# Patient Record
Sex: Female | Born: 1965
Health system: Southern US, Community
[De-identification: ages and names within clinical notes are randomized; demographics above are authoritative.]

## PROBLEM LIST (undated history)

## (undated) DIAGNOSIS — B019 Varicella without complication: Secondary | ICD-10-CM

## (undated) DIAGNOSIS — F419 Anxiety disorder, unspecified: Secondary | ICD-10-CM

## (undated) DIAGNOSIS — F32A Depression, unspecified: Secondary | ICD-10-CM

## (undated) DIAGNOSIS — N870 Mild cervical dysplasia: Secondary | ICD-10-CM

## (undated) DIAGNOSIS — R519 Headache, unspecified: Secondary | ICD-10-CM

## (undated) DIAGNOSIS — R51 Headache: Secondary | ICD-10-CM

## (undated) DIAGNOSIS — N644 Mastodynia: Secondary | ICD-10-CM

## (undated) DIAGNOSIS — F329 Major depressive disorder, single episode, unspecified: Secondary | ICD-10-CM

## (undated) DIAGNOSIS — D649 Anemia, unspecified: Secondary | ICD-10-CM

## (undated) DIAGNOSIS — R87623 High grade squamous intraepithelial lesion on cytologic smear of vagina (HGSIL): Secondary | ICD-10-CM

## (undated) DIAGNOSIS — Z460 Encounter for fitting and adjustment of spectacles and contact lenses: Secondary | ICD-10-CM

## (undated) HISTORY — PX: FOOT BONE EXCISION: SUR493

## (undated) HISTORY — PX: LESION EXCISION: SHX5167

## (undated) HISTORY — PX: INGUINAL HERNIA REPAIR: SUR1180

## (undated) HISTORY — DX: High grade squamous intraepithelial lesion on cytologic smear of vagina (HGSIL): R87.623

## (undated) HISTORY — DX: Varicella without complication: B01.9

## (undated) HISTORY — DX: Mild cervical dysplasia: N87.0

## (undated) HISTORY — DX: Mastodynia: N64.4

## (undated) HISTORY — PX: METATARSAL OSTEOTOMY: SHX1641

## (undated) HISTORY — PX: SHOULDER ARTHROSCOPY: SHX128

## (undated) HISTORY — PX: LIPOMA EXCISION: SHX5283

## (undated) HISTORY — PX: KNEE ARTHROSCOPY W/ ACL RECONSTRUCTION: SHX1858

---

## 1998-09-29 ENCOUNTER — Other Ambulatory Visit: Admission: RE | Admit: 1998-09-29 | Discharge: 1998-09-29 | Payer: Self-pay | Admitting: Obstetrics & Gynecology

## 2000-02-21 ENCOUNTER — Other Ambulatory Visit: Admission: RE | Admit: 2000-02-21 | Discharge: 2000-02-21 | Payer: Self-pay | Admitting: Obstetrics & Gynecology

## 2001-01-21 ENCOUNTER — Other Ambulatory Visit: Admission: RE | Admit: 2001-01-21 | Discharge: 2001-01-21 | Payer: Self-pay | Admitting: Obstetrics & Gynecology

## 2002-03-05 ENCOUNTER — Other Ambulatory Visit: Admission: RE | Admit: 2002-03-05 | Discharge: 2002-03-05 | Payer: Self-pay | Admitting: Obstetrics & Gynecology

## 2002-04-17 HISTORY — PX: CERVICAL CONIZATION W/BX: SHX1330

## 2002-07-16 ENCOUNTER — Other Ambulatory Visit: Admission: RE | Admit: 2002-07-16 | Discharge: 2002-07-16 | Payer: Self-pay | Admitting: Obstetrics & Gynecology

## 2002-07-17 ENCOUNTER — Other Ambulatory Visit: Admission: RE | Admit: 2002-07-17 | Discharge: 2002-07-17 | Payer: Self-pay | Admitting: Obstetrics & Gynecology

## 2002-12-05 ENCOUNTER — Other Ambulatory Visit: Admission: RE | Admit: 2002-12-05 | Discharge: 2002-12-05 | Payer: Self-pay | Admitting: Obstetrics & Gynecology

## 2003-01-29 ENCOUNTER — Other Ambulatory Visit: Admission: RE | Admit: 2003-01-29 | Discharge: 2003-01-29 | Payer: Self-pay | Admitting: Obstetrics & Gynecology

## 2003-02-23 ENCOUNTER — Ambulatory Visit (HOSPITAL_COMMUNITY): Admission: RE | Admit: 2003-02-23 | Discharge: 2003-02-23 | Payer: Self-pay | Admitting: Obstetrics & Gynecology

## 2003-02-23 ENCOUNTER — Encounter (INDEPENDENT_AMBULATORY_CARE_PROVIDER_SITE_OTHER): Payer: Self-pay | Admitting: Specialist

## 2004-02-11 ENCOUNTER — Other Ambulatory Visit: Admission: RE | Admit: 2004-02-11 | Discharge: 2004-02-11 | Payer: Self-pay | Admitting: Obstetrics & Gynecology

## 2005-02-22 ENCOUNTER — Other Ambulatory Visit: Admission: RE | Admit: 2005-02-22 | Discharge: 2005-02-22 | Payer: Self-pay | Admitting: Obstetrics & Gynecology

## 2006-04-17 HISTORY — PX: LIPOMA EXCISION: SHX5283

## 2006-09-12 ENCOUNTER — Ambulatory Visit (HOSPITAL_BASED_OUTPATIENT_CLINIC_OR_DEPARTMENT_OTHER): Admission: RE | Admit: 2006-09-12 | Discharge: 2006-09-12 | Payer: Self-pay | Admitting: Orthopedic Surgery

## 2007-04-02 ENCOUNTER — Encounter (INDEPENDENT_AMBULATORY_CARE_PROVIDER_SITE_OTHER): Payer: Self-pay | Admitting: Surgery

## 2007-04-02 ENCOUNTER — Ambulatory Visit (HOSPITAL_BASED_OUTPATIENT_CLINIC_OR_DEPARTMENT_OTHER): Admission: RE | Admit: 2007-04-02 | Discharge: 2007-04-02 | Payer: Self-pay | Admitting: Surgery

## 2009-02-10 ENCOUNTER — Encounter: Admission: RE | Admit: 2009-02-10 | Discharge: 2009-02-10 | Payer: Self-pay | Admitting: Obstetrics & Gynecology

## 2009-04-17 HISTORY — PX: UMBILICAL HERNIA REPAIR: SHX196

## 2009-04-17 HISTORY — PX: HERNIA REPAIR: SHX51

## 2009-07-16 ENCOUNTER — Encounter: Admission: RE | Admit: 2009-07-16 | Discharge: 2009-07-16 | Payer: Self-pay | Admitting: General Surgery

## 2010-08-30 NOTE — Op Note (Signed)
[name], UN         ACCOUNT NO.:  [id number]   MEDICAL RECORD NO.:  [id number]          PATIENT TYPE:  AMB   LOCATION:  DSC                          FACILITY:  [facility]   PHYSICIAN:  Mila Homer. Sherlean Foot, M.D. DATE OF BIRTH:  [birth year]   DATE OF PROCEDURE:  09/12/2006  DATE OF DISCHARGE:                               OPERATIVE REPORT   SURGEON:  Mila Homer. Sherlean Foot, M.D.   ASSISTANTArlys John D. Petrarca, P.A.-C.   ANESTHESIA:  General.   PREOPERATIVE DIAGNOSIS:  Left knee chronic ACL tear and lateral meniscus  tear.   POSTOPERATIVE DIAGNOSIS:  Left knee chronic ACL tear and lateral  meniscus tear.   PROCEDURE:  Left knee arthroscopy, ACL reconstruction and partial  lateral meniscectomy.   INDICATIONS FOR PROCEDURE:  The patient is a 45 year old white female  with chronic ACL instability and now with an acute lateral meniscus tear  and informed consent was obtained.   DESCRIPTION OF PROCEDURE:  The patient was laid supine, administered  general anesthesia, the left leg was prepped and draped in the usual  sterile fashion.  Inferolateral inferomedial portals created with a #11  blade, blunt trocar and cannula.  There was no chondromalacia in the  joint at all.  There was a chronic ACL tear with __________ sign.  There  was a complex posterior horn lateral meniscus tear, partial lateral  meniscectomy was performed a straight basket forceps and a Conservation officer, nature.  Medial meniscus had a far peripheral undersurface tear that was  not full thickness not all the way through.  I debrided this area,  elected not to put any devices in since this knee was so small, I felt  like things would be quite prominent.  I then debrided the old ACL and  used a 4-mm cylindrical bur to perform a notchplasty.  I then used  Arthrex system creating 10-mm tibial and femoral tunnels.  I then placed  a nitinol wire anterior to the femoral tunnel, placed a 7 x 25 mm  interference screw.  Then  with tension on the graft and posterior joint  10 degrees flexion, I placed a 8 x 25 mm interference screw in the tibia  with the nitinol wire anterior to the tibial bone plug entering into the  joint.  I then assessed clinically and visually and had negative drawer,  Lachman.  I lavaged the knee then let the tourniquet down.  This was at  27 minutes.  I then irrigated closed with 4-0 nylon suture, dressed with  Xeroform dressing sponges, sterile Webril and Ace wrap.   COMPLICATIONS:  None.   DRAINS:  None.           ______________________________  Mila Homer. Sherlean Foot, M.D.    SDL/MEDQ  D:  09/12/2006  T:  09/12/2006  Job:  [id number]

## 2010-08-30 NOTE — Op Note (Signed)
Sara Davidson, SWARTZENTRUBER         ACCOUNT NO.:  [id number]   MEDICAL RECORD NO.:  [id number]          PATIENT TYPE:  AMB   LOCATION:  DSC                          FACILITY:  [facility]   PHYSICIAN:  Currie Paris, M.D.DATE OF BIRTH:  [birth year]   DATE OF PROCEDURE:  04/02/2007  DATE OF DISCHARGE:                               OPERATIVE REPORT   OFFICE MEDICAL RECORD NUMBER:  [id number]   PREOPERATIVE DIAGNOSIS:  Soft tissue mass, left forearm, probable  lipoma.   POSTOPERATIVE DIAGNOSIS:  Soft tissue mass, left forearm, probable  lipoma.   OPERATION:  Excision of small soft tissue mass, left forearm.   SURGEON:  Currie Paris, M.D.   ANESTHESIA:  Local (1% Xylocaine with epinephrine).   CLINICAL HISTORY:  This patient presented with a soft tissue mass that  felt like a lipoma of the left forearm and it was somewhat bothersome to  her, and she wished to have this removed.   DESCRIPTION OF PROCEDURE:  In the minor procedure room, we confirmed the  plans for the surgery.  The mass was identified and I circled it with a  pen so we knew its exact location.  The area was then anesthetized with  1% Xylocaine with epinephrine.   I waited about 5 minutes to 10 minutes for that soak in and then prepped  the area with Betadine and draped it as a sterile field.  I made a short  incision directly over the marked area and made sure I identified what  appeared to be a lipoma and dissected this out with hemostat dissection  and it came out intact.  A small vessel was attached to it that was  tied.   Once everything was dry, I closed with some 3-0 Vicryl and 4-0 Monocryl  subcuticular plus Dermabond.  The patient tolerated the procedure well  and there were no complications.      Currie Paris, M.D.  Electronically Signed     CJS/MEDQ  D:  04/02/2007  T:  04/03/2007  Job:  [id number]

## 2010-09-02 NOTE — Op Note (Signed)
Sara Davidson, Sara Davidson                   ACCOUNT NO.:  [id number]   MEDICAL RECORD NO.:  [id number]                   PATIENT TYPE:  AMB   LOCATION:  SDC                                  FACILITY:  [facility]   PHYSICIAN:  Gerrit Friends. Aldona Bar, M.D.                DATE OF BIRTH:  January 17, 1966   DATE OF PROCEDURE:  02/23/2003  DATE OF DISCHARGE:                                 OPERATIVE REPORT   PREOPERATIVE DIAGNOSIS:  Persistent dysplasia of the cervix.   POSTOPERATIVE DIAGNOSIS:  Persistent dysplasia of the cervix, pathology  pending.   PROCEDURE:  Conization of the cervix.   SURGEON:  Gerrit Friends. Aldona Bar, M.D.   ANESTHESIA:  Intravenous conscious sedation plus circumferential local  anesthesia to the cervix with 1% Xylocaine with epinephrine, 18 mL.   SURGEON:  Gerrit Friends. Aldona Bar, M.D.   HISTORY:  This 45 year old patient had moderate dysplasia and underwent  colposcopy and subsequent cryotherapy.  Subsequent to the cryotherapy, her  Pap smear remained as mild dysplasia.  This was repeated with the same.  She  underwent re-colposcopy with the finding of a lesion seen just within the  external os, and the transformation zone was not seen.  A decision was made  to proceed with the conization of the cervix for persistent dysplasia of the  cervix.   DESCRIPTION OF PROCEDURE:  The patient was taken to the operating room,  where after the satisfactory introduction of intravenous conscious sedation  she was prepped and draped having been placed in the modified lithotomy  position in the short Allen stirrups in the usual manner for vaginal  surgery.   At this time a speculum was placed and a single-tooth tenaculum placed on  the anterior lip of the cervix.  The cervix was circumferentially injected  with 1% Xylocaine without epinephrine totaling 18 mL.  After good anesthetic  timing, the procedure was begun.  Using the #11 blade, the conization of the  cervix was carried out in the usual  fashion, encompassing most of the  endocervix.  The specimen was removed and pinned appropriately, was sent to  the lab.  The remaining endocervix was then curetted with a Kevorkian  curette and that specimen likewise sent labeled appropriately.  At this time  using the coagulating ball, the conization bed was adequately coagulated.  Thereafter as extra insurance, Monsel's solution was applied to the  conization bed.  All instruments at this time were removed and the procedure  was felt to be complete.  Estimated blood loss negligible.  All counts  correct x2.  Pathologic specimen consisted of conization of the cervix and  endocervical curettings.  The patient was taken to the recovery room in  satisfactory condition, having tolerated the procedure well.  Estimated  blood loss as mentioned.  She will be discharged to home and will follow up  in the office and will be given appropriate instructions at the time of  discharge.                                              Gerrit Friends. Aldona Bar, M.D.   RMW/MEDQ  D:  02/23/2003  T:  02/23/2003  Job:  [id number]

## 2011-05-22 ENCOUNTER — Other Ambulatory Visit: Payer: Self-pay | Admitting: Obstetrics & Gynecology

## 2012-06-04 ENCOUNTER — Other Ambulatory Visit: Payer: Self-pay | Admitting: Obstetrics & Gynecology

## 2012-06-04 DIAGNOSIS — R928 Other abnormal and inconclusive findings on diagnostic imaging of breast: Secondary | ICD-10-CM

## 2012-06-07 ENCOUNTER — Ambulatory Visit
Admission: RE | Admit: 2012-06-07 | Discharge: 2012-06-07 | Disposition: A | Discharge status: Home / Self Care | Payer: BC Managed Care – PPO | Source: Ambulatory Visit | Attending: Obstetrics & Gynecology | Admitting: Obstetrics & Gynecology

## 2012-06-07 ENCOUNTER — Other Ambulatory Visit: Payer: Self-pay | Admitting: Obstetrics & Gynecology

## 2012-06-07 DIAGNOSIS — R928 Other abnormal and inconclusive findings on diagnostic imaging of breast: Secondary | ICD-10-CM

## 2012-06-10 ENCOUNTER — Ambulatory Visit
Admission: RE | Admit: 2012-06-10 | Discharge: 2012-06-10 | Disposition: A | Discharge status: Home / Self Care | Payer: BC Managed Care – PPO | Source: Ambulatory Visit | Attending: Obstetrics & Gynecology | Admitting: Obstetrics & Gynecology

## 2012-06-10 DIAGNOSIS — R928 Other abnormal and inconclusive findings on diagnostic imaging of breast: Secondary | ICD-10-CM

## 2012-06-24 ENCOUNTER — Ambulatory Visit (INDEPENDENT_AMBULATORY_CARE_PROVIDER_SITE_OTHER): Payer: BC Managed Care – PPO | Admitting: General Surgery

## 2012-06-24 ENCOUNTER — Encounter (INDEPENDENT_AMBULATORY_CARE_PROVIDER_SITE_OTHER): Payer: Self-pay | Admitting: General Surgery

## 2012-06-24 VITALS — BP 100/70 | HR 79 | Temp 98.5°F | Resp 18 | Ht 66.0 in | Wt 134.0 lb

## 2012-06-24 DIAGNOSIS — R921 Mammographic calcification found on diagnostic imaging of breast: Secondary | ICD-10-CM

## 2012-06-24 DIAGNOSIS — R928 Other abnormal and inconclusive findings on diagnostic imaging of breast: Secondary | ICD-10-CM

## 2012-06-24 NOTE — Progress Notes (Signed)
Patient ID: Sara Davidson, Davidson   DOB: [birth year], 47 y.o.   MRN: [id number]  Chief Complaint  Patient presents with  . New Evaluation    eval Rt br calcification    HPI Sara Davidson.  Referred by Dr. Anselmo Pickler HPI This is a 9 her old Davidson who is otherwise healthy. She has a family history of breast cancer her mother in 2012. She has no history referable to either breast. She underwent a screening mammogram recently which showed an indeterminate date area of calcifications in the upper right breast measuring 3 mm. This is a by rads 4 mammogram. She was recommended for stereotactic biopsy. These were not able to be adequately visualized for stereotactic biopsy. She was then referred for evaluation for surgical excision of these areas. She comes in today to discuss surgery as well as possible follow up with six-month mammography. She has no complaints with either breast today. History reviewed. No pertinent past medical history.  Past Surgical History  Procedure Laterality Date  . Knee arthroscopy w/ acl reconstruction  [id number]    Left  . Hernia repair  2011    UMB    Family History  Problem Relation Age of Onset  . Cancer Mother     Breast  . Bipolar disorder Father     Social History History  Substance Use Topics  . Smoking status: Never Smoker   . Smokeless tobacco: Never Used  . Alcohol Use: Yes     Comment: 2/day.    Allergies  Allergen Reactions  . Sulfa Antibiotics Hives    Current Outpatient Prescriptions  Medication Sig Dispense Refill  . buPROPion (WELLBUTRIN XL) 150 MG 24 hr tablet Take 150 mg by mouth daily.      . calcium carbonate 200 MG capsule Take 250 mg by mouth 2 (two) times daily with a meal.      . citalopram (CELEXA) 10 MG tablet Take 10 mg by mouth daily.      Marland Kitchen co-enzyme Q-10 30 MG capsule Take 30 mg by mouth 3 (three) times daily.      . fish oil-omega-3 fatty acids 1000 MG capsule Take 2 g by mouth  daily.      Marland Kitchen lamoTRIgine (LAMICTAL) 150 MG tablet Take 150 mg by mouth daily.      . Multiple Vitamins-Minerals (MULTIVITAMIN WITH MINERALS) tablet Take 1 tablet by mouth daily.       No current facility-administered medications for this visit.    Review of Systems Review of Systems  Constitutional: Negative for fever, chills and unexpected weight change.  HENT: Negative for hearing loss, congestion, sore throat, trouble swallowing and voice change.   Eyes: Negative for visual disturbance.  Respiratory: Negative for cough and wheezing.   Cardiovascular: Negative for chest pain, palpitations and leg swelling.  Gastrointestinal: Negative for nausea, vomiting, abdominal pain, diarrhea, constipation, blood in stool, abdominal distention and anal bleeding.  Genitourinary: Negative for hematuria, vaginal bleeding and difficulty urinating.  Musculoskeletal: Negative for arthralgias.  Skin: Negative for rash and wound.  Neurological: Negative for seizures, syncope and headaches.  Hematological: Negative for adenopathy. Does not bruise/bleed easily.  Psychiatric/Behavioral: Negative for confusion.    Blood pressure 100/70, pulse 79, temperature 98.5 F (36.9 C), temperature source Temporal, resp. rate 18, height 5\' 6"  (1.676 m), weight 134 lb (60.782 kg).  Physical Exam Physical Exam  Vitals reviewed. Constitutional: She appears well-developed and well-nourished.  Neck: Neck supple.  Cardiovascular: Normal rate,  regular rhythm and normal heart sounds.   Pulmonary/Chest: Effort normal and breath sounds normal. She has no wheezes. She has no rales. Right breast exhibits no inverted nipple, no mass, no nipple discharge, no skin change and no tenderness. Left breast exhibits no inverted nipple, no mass, no nipple discharge, no skin change and no tenderness. Breasts are symmetrical.  Lymphadenopathy:    She has no cervical adenopathy.    She has no axillary adenopathy.       Right: No  supraclavicular adenopathy present.       Left: No supraclavicular adenopathy present.    Data Reviewed Following discussion with her husband, the patient would like to  arrange consultation with Dr. Dwain Sarna at [facility]  surgery. This has been arranged for 06/24/2012 and I have  contacted the patient to give her this appointment information.  The patient was encouraged to call the Breast Center for additional  questions or concerns.  Addended by: Rolla Plate, M.D. on 06/11/2012 09:20:15.  **END ADDENDUM** SIGNED BY: Rolla Plate, M.D.       [name], MD Mon Jun 10, 2012 4:28:47 PM EST       **ADDENDUM** CREATED: 06/10/2012 16:20:01  The patient returns for possible right breast stereotactic core  biopsy of a very faint calcifications located within the superior  portion of the right breast. Attempts were made to visualize this  faint cluster of calcifications in both the lateral and CC  projections. However, these were not adequate visualized for  stereotactic core biopsy. Additional magnification CC views were  obtained to better demonstrate the calcifications in the CC  projection. These demonstrate a small cluster of calcifications to  be located slightly medially within the central right breast.  There are also scattered punctate benign-appearing calcifications  noted within the superior and medial right breast. I have  discussed options with the patient. I recommend surgical  consultation for possible surgical excisional biopsy of the small  faint cluster of calcifications. If the patient following this  consultation prefers imaging follow-up evaluation, I recommend  right breast diagnostic mammography with magnification views in 6  months.  The patient preferred to talk to her husband prior to arranging  surgical consultation. She will contact the Breast Center to help  arrange surgical consultation following this conversation with the   husband.     Assessment    Right breast calcifications not amenable to stereotactic biopsy    Plan    We discussed a 6 month follow-up versus a biopsy. Unfortunately she is not amenable to a stereotactic biopsy. These areas are a BI-RADS 4. She does have a family history her mother of breast cancer which raises her risk as well. We discussed that with a 6 month followup this probably would not be breast cancer but we cannot guarantee her that. I discussed a right breast wire localized excision of these calcifications in the upper right breast. We discussed how that would be performed and the risks associated with it. I did tell them that this was the only way to ensure that this was not a precancerous or cancerous area. It is likely benign but I also recommended excision because of the appearance, this is new, and her family history I recommended excision of these areas. She's going to consider this overnight and will call me back.        WAKEFIELD,MATTHEW 06/24/2012, 3:44 PM

## 2012-06-26 ENCOUNTER — Other Ambulatory Visit (INDEPENDENT_AMBULATORY_CARE_PROVIDER_SITE_OTHER): Payer: Self-pay | Admitting: General Surgery

## 2012-06-26 DIAGNOSIS — R921 Mammographic calcification found on diagnostic imaging of breast: Secondary | ICD-10-CM

## 2012-07-01 ENCOUNTER — Encounter (HOSPITAL_BASED_OUTPATIENT_CLINIC_OR_DEPARTMENT_OTHER): Payer: Self-pay | Admitting: *Deleted

## 2012-07-01 NOTE — Progress Notes (Signed)
To come in for [facility] labs- 

## 2012-07-03 ENCOUNTER — Encounter (HOSPITAL_BASED_OUTPATIENT_CLINIC_OR_DEPARTMENT_OTHER)
Admission: RE | Admit: 2012-07-03 | Discharge: 2012-07-03 | Disposition: A | Discharge status: Home / Self Care | Payer: BC Managed Care – PPO | Source: Ambulatory Visit | Attending: General Surgery | Admitting: General Surgery

## 2012-07-03 LAB — CBC WITH DIFFERENTIAL/PLATELET
Basophils Relative: 1 % (ref 0–1)
HCT: 37.5 % (ref 36.0–46.0)
Lymphocytes Relative: 29 % (ref 12–46)
Lymphs Abs: 1.4 10*3/uL (ref 0.7–4.0)
MCHC: 34.1 g/dL (ref 30.0–36.0)
MCV: 90.8 fL (ref 78.0–100.0)
Monocytes Relative: 10 % (ref 3–12)
Neutrophils Relative %: 58 % (ref 43–77)
RBC: 4.13 MIL/uL (ref 3.87–5.11)
WBC: 4.8 10*3/uL (ref 4.0–10.5)

## 2012-07-03 LAB — BASIC METABOLIC PANEL
Calcium: 9.5 mg/dL (ref 8.4–10.5)
Potassium: 4.4 mEq/L (ref 3.5–5.1)
Sodium: 138 mEq/L (ref 135–145)

## 2012-07-04 ENCOUNTER — Ambulatory Visit (HOSPITAL_BASED_OUTPATIENT_CLINIC_OR_DEPARTMENT_OTHER)
Admission: RE | Admit: 2012-07-04 | Discharge: 2012-07-04 | Disposition: A | Discharge status: Home / Self Care | Payer: BC Managed Care – PPO | Source: Ambulatory Visit | Attending: General Surgery | Admitting: General Surgery

## 2012-07-04 ENCOUNTER — Ambulatory Visit (HOSPITAL_BASED_OUTPATIENT_CLINIC_OR_DEPARTMENT_OTHER): Payer: BC Managed Care – PPO | Admitting: Anesthesiology

## 2012-07-04 ENCOUNTER — Ambulatory Visit
Admission: RE | Admit: 2012-07-04 | Discharge: 2012-07-04 | Disposition: A | Discharge status: Home / Self Care | Payer: BC Managed Care – PPO | Source: Ambulatory Visit | Attending: General Surgery | Admitting: General Surgery

## 2012-07-04 ENCOUNTER — Encounter (HOSPITAL_BASED_OUTPATIENT_CLINIC_OR_DEPARTMENT_OTHER): Payer: Self-pay | Admitting: Anesthesiology

## 2012-07-04 ENCOUNTER — Encounter (HOSPITAL_BASED_OUTPATIENT_CLINIC_OR_DEPARTMENT_OTHER)
Admission: RE | Disposition: A | Discharge status: Home / Self Care | Payer: Self-pay | Source: Ambulatory Visit | Attending: General Surgery

## 2012-07-04 ENCOUNTER — Encounter (HOSPITAL_BASED_OUTPATIENT_CLINIC_OR_DEPARTMENT_OTHER): Payer: Self-pay | Admitting: *Deleted

## 2012-07-04 DIAGNOSIS — N6019 Diffuse cystic mastopathy of unspecified breast: Secondary | ICD-10-CM

## 2012-07-04 DIAGNOSIS — Z01812 Encounter for preprocedural laboratory examination: Secondary | ICD-10-CM | POA: Insufficient documentation

## 2012-07-04 DIAGNOSIS — R920 Mammographic microcalcification found on diagnostic imaging of breast: Secondary | ICD-10-CM

## 2012-07-04 DIAGNOSIS — Z803 Family history of malignant neoplasm of breast: Secondary | ICD-10-CM | POA: Insufficient documentation

## 2012-07-04 DIAGNOSIS — R921 Mammographic calcification found on diagnostic imaging of breast: Secondary | ICD-10-CM

## 2012-07-04 HISTORY — DX: Depression, unspecified: F32.A

## 2012-07-04 HISTORY — PX: BREAST BIOPSY: SHX20

## 2012-07-04 HISTORY — DX: Major depressive disorder, single episode, unspecified: F32.9

## 2012-07-04 HISTORY — DX: Encounter for fitting and adjustment of spectacles and contact lenses: Z46.0

## 2012-07-04 HISTORY — DX: Anxiety disorder, unspecified: F41.9

## 2012-07-04 LAB — POCT HEMOGLOBIN-HEMACUE: Hemoglobin: 14.5 g/dL (ref 12.0–15.0)

## 2012-07-04 SURGERY — BREAST BIOPSY WITH NEEDLE LOCALIZATION
Anesthesia: General | Site: Breast | Laterality: Right | Wound class: Clean

## 2012-07-04 MED ORDER — EPHEDRINE SULFATE 50 MG/ML IJ SOLN
INTRAMUSCULAR | Status: DC | PRN
  Administered 2012-07-04 (×2): 10 mg via INTRAVENOUS

## 2012-07-04 MED ORDER — BUPIVACAINE HCL (PF) 0.25 % IJ SOLN
INTRAMUSCULAR | Status: DC | PRN
  Administered 2012-07-04: 10 mL

## 2012-07-04 MED ORDER — OXYCODONE HCL 5 MG/5ML PO SOLN: 5.0000 mg | Freq: Once | ORAL | Status: DC | PRN

## 2012-07-04 MED ORDER — CEFAZOLIN SODIUM-DEXTROSE 2-3 GM-% IV SOLR
2.0000 g | INTRAVENOUS | Status: AC
  Administered 2012-07-04: 2 g via INTRAVENOUS

## 2012-07-04 MED ORDER — SUCCINYLCHOLINE CHLORIDE 20 MG/ML IJ SOLN
INTRAMUSCULAR | Status: DC | PRN
  Administered 2012-07-04: 40 mg via INTRAVENOUS

## 2012-07-04 MED ORDER — ONDANSETRON HCL 4 MG/2ML IJ SOLN
INTRAMUSCULAR | Status: DC | PRN
  Administered 2012-07-04: 4 mg via INTRAVENOUS

## 2012-07-04 MED ORDER — DEXAMETHASONE SODIUM PHOSPHATE 4 MG/ML IJ SOLN
INTRAMUSCULAR | Status: DC | PRN
  Administered 2012-07-04: 10 mg via INTRAVENOUS

## 2012-07-04 MED ORDER — SUFENTANIL CITRATE 50 MCG/ML IV SOLN
INTRAVENOUS | Status: DC | PRN
  Administered 2012-07-04: 10 ug via INTRAVENOUS

## 2012-07-04 MED ORDER — FENTANYL CITRATE 0.05 MG/ML IJ SOLN: 50.0000 ug | INTRAMUSCULAR | Status: DC | PRN

## 2012-07-04 MED ORDER — MIDAZOLAM HCL 2 MG/2ML IJ SOLN: 1.0000 mg | INTRAMUSCULAR | Status: DC | PRN

## 2012-07-04 MED ORDER — OXYCODONE-ACETAMINOPHEN 5-325 MG PO TABS: 1.0000 | ORAL_TABLET | ORAL | Status: DC | PRN

## 2012-07-04 MED ORDER — MIDAZOLAM HCL 5 MG/5ML IJ SOLN
INTRAMUSCULAR | Status: DC | PRN
  Administered 2012-07-04: 2 mg via INTRAVENOUS

## 2012-07-04 MED ORDER — OXYCODONE HCL 5 MG PO TABS: 5.0000 mg | ORAL_TABLET | Freq: Once | ORAL | Status: DC | PRN

## 2012-07-04 MED ORDER — LACTATED RINGERS IV SOLN
INTRAVENOUS | Status: DC
  Administered 2012-07-04: 11:00:00 via INTRAVENOUS
  Administered 2012-07-04: 20 mL/h via INTRAVENOUS

## 2012-07-04 MED ORDER — HYDROMORPHONE HCL PF 1 MG/ML IJ SOLN
0.2500 mg | INTRAMUSCULAR | Status: DC | PRN
  Administered 2012-07-04 (×4): 0.5 mg via INTRAVENOUS

## 2012-07-04 MED ORDER — LIDOCAINE HCL (CARDIAC) 20 MG/ML IV SOLN
INTRAVENOUS | Status: DC | PRN
  Administered 2012-07-04: 50 mg via INTRAVENOUS

## 2012-07-04 SURGICAL SUPPLY — 54 items
ADH SKN CLS APL DERMABOND .7 (GAUZE/BANDAGES/DRESSINGS)
APL SKNCLS STERI-STRIP NONHPOA (GAUZE/BANDAGES/DRESSINGS) ×1
APPLIER CLIP 9.375 MED OPEN (MISCELLANEOUS)
APR CLP MED 9.3 20 MLT OPN (MISCELLANEOUS)
BENZOIN TINCTURE PRP APPL 2/3 (GAUZE/BANDAGES/DRESSINGS) ×2 IMPLANT
BINDER BREAST LRG (GAUZE/BANDAGES/DRESSINGS) IMPLANT
BINDER BREAST MEDIUM (GAUZE/BANDAGES/DRESSINGS) IMPLANT
BINDER BREAST XLRG (GAUZE/BANDAGES/DRESSINGS) IMPLANT
BINDER BREAST XXLRG (GAUZE/BANDAGES/DRESSINGS) IMPLANT
BLADE SURG 15 STRL LF DISP TIS (BLADE) ×1 IMPLANT
BLADE SURG 15 STRL SS (BLADE) ×2
CANISTER SUCTION 1200CC (MISCELLANEOUS) IMPLANT
CHLORAPREP W/TINT 26ML (MISCELLANEOUS) ×2 IMPLANT
CLIP APPLIE 9.375 MED OPEN (MISCELLANEOUS) IMPLANT
CLOTH BEACON ORANGE TIMEOUT ST (SAFETY) ×2 IMPLANT
COVER MAYO STAND STRL (DRAPES) ×2 IMPLANT
COVER TABLE BACK 60X90 (DRAPES) ×2 IMPLANT
DECANTER SPIKE VIAL GLASS SM (MISCELLANEOUS) IMPLANT
DERMABOND ADVANCED (GAUZE/BANDAGES/DRESSINGS)
DERMABOND ADVANCED .7 DNX12 (GAUZE/BANDAGES/DRESSINGS) IMPLANT
DEVICE DUBIN W/COMP PLATE 8390 (MISCELLANEOUS) IMPLANT
DRAPE PED LAPAROTOMY (DRAPES) ×2 IMPLANT
DRSG TEGADERM 4X4.75 (GAUZE/BANDAGES/DRESSINGS) ×2 IMPLANT
ELECT COATED BLADE 2.86 ST (ELECTRODE) ×2 IMPLANT
ELECT REM PT RETURN 9FT ADLT (ELECTROSURGICAL) ×2
ELECTRODE REM PT RTRN 9FT ADLT (ELECTROSURGICAL) ×1 IMPLANT
GAUZE SPONGE 4X4 12PLY STRL LF (GAUZE/BANDAGES/DRESSINGS) ×2 IMPLANT
GLOVE BIO SURGEON STRL SZ7 (GLOVE) ×2 IMPLANT
GLOVE BIOGEL M 7.0 STRL (GLOVE) ×1 IMPLANT
GLOVE BIOGEL PI IND STRL 7.5 (GLOVE) ×1 IMPLANT
GLOVE BIOGEL PI INDICATOR 7.5 (GLOVE) ×2
GOWN PREVENTION PLUS XLARGE (GOWN DISPOSABLE) ×2 IMPLANT
NDL HYPO 25X1 1.5 SAFETY (NEEDLE) ×1 IMPLANT
NEEDLE HYPO 25X1 1.5 SAFETY (NEEDLE) ×2 IMPLANT
NS IRRIG 1000ML POUR BTL (IV SOLUTION) IMPLANT
PACK BASIN DAY SURGERY FS (CUSTOM PROCEDURE TRAY) ×2 IMPLANT
PENCIL BUTTON HOLSTER BLD 10FT (ELECTRODE) ×2 IMPLANT
SLEEVE SCD COMPRESS KNEE MED (MISCELLANEOUS) ×2 IMPLANT
SPONGE LAP 4X18 X RAY DECT (DISPOSABLE) ×2 IMPLANT
STRIP CLOSURE SKIN 1/2X4 (GAUZE/BANDAGES/DRESSINGS) ×2 IMPLANT
SUT MNCRL AB 4-0 PS2 18 (SUTURE) IMPLANT
SUT MON AB 5-0 PS2 18 (SUTURE) IMPLANT
SUT SILK 2 0 SH (SUTURE) ×2 IMPLANT
SUT VIC AB 2-0 SH 27 (SUTURE) ×2
SUT VIC AB 2-0 SH 27XBRD (SUTURE) ×1 IMPLANT
SUT VIC AB 3-0 SH 27 (SUTURE) ×2
SUT VIC AB 3-0 SH 27X BRD (SUTURE) ×1 IMPLANT
SUT VIC AB 5-0 PS2 18 (SUTURE) IMPLANT
SUT VICRYL AB 3 0 TIES (SUTURE) IMPLANT
SYR CONTROL 10ML LL (SYRINGE) ×2 IMPLANT
TOWEL OR 17X24 6PK STRL BLUE (TOWEL DISPOSABLE) ×2 IMPLANT
TOWEL OR NON WOVEN STRL DISP B (DISPOSABLE) ×2 IMPLANT
TUBE CONNECTING 20X1/4 (TUBING) IMPLANT
YANKAUER SUCT BULB TIP NO VENT (SUCTIONS) IMPLANT

## 2012-07-04 NOTE — Anesthesia Preprocedure Evaluation (Addendum)
Anesthesia Evaluation  Patient identified by MRN, date of birth, ID band Patient awake    Reviewed: Allergy & Precautions, H&P , NPO status , Patient's Chart, lab work & pertinent test results  Airway Mallampati: III TM Distance: >3 FB Neck ROM: Full    Dental no notable dental hx. (+) Teeth Intact and Dental Advisory Given   Pulmonary neg pulmonary ROS,  breath sounds clear to auscultation  Pulmonary exam normal       Cardiovascular negative cardio ROS  Rhythm:Regular Rate:Normal     Neuro/Psych PSYCHIATRIC DISORDERS negative neurological ROS     GI/Hepatic negative GI ROS, Neg liver ROS,   Endo/Other  negative endocrine ROS  Renal/GU negative Renal ROS  negative genitourinary   Musculoskeletal   Abdominal   Peds  Hematology negative hematology ROS (+)   Anesthesia Other Findings   Reproductive/Obstetrics negative OB ROS                          Anesthesia Physical Anesthesia Plan  ASA: II  Anesthesia Plan: General   Post-op Pain Management:    Induction: Intravenous  Airway Management Planned: LMA  Additional Equipment:   Intra-op Plan:   Post-operative Plan: Extubation in OR  Informed Consent: I have reviewed the patients History and Physical, chart, labs and discussed the procedure including the risks, benefits and alternatives for the proposed anesthesia with the patient or authorized representative who has indicated his/her understanding and acceptance.   Dental advisory given  Plan Discussed with: CRNA  Anesthesia Plan Comments:         Anesthesia Quick Evaluation

## 2012-07-04 NOTE — Anesthesia Procedure Notes (Signed)
Procedure Name: LMA Insertion Date/Time: 07/04/2012 11:07 AM Performed by: Zenia Resides D Pre-anesthesia Checklist: Patient identified, Emergency Drugs available, Suction available and Patient being monitored Patient Re-evaluated:Patient Re-evaluated prior to inductionOxygen Delivery Method: Circle System Utilized Preoxygenation: Pre-oxygenation with 100% oxygen Intubation Type: IV induction Ventilation: Mask ventilation without difficulty LMA: LMA inserted LMA Size: 4.0 Number of attempts: 1 Airway Equipment and Method: bite block Placement Confirmation: positive ETCO2 and breath sounds checked- equal and bilateral Tube secured with: Tape Dental Injury: Teeth and Oropharynx as per pre-operative assessment

## 2012-07-04 NOTE — Transfer of Care (Signed)
Immediate Anesthesia Transfer of Care Note  Patient: Sara Davidson  Procedure(s) Performed: Procedure(s): Right Breast Wire Localized Excision (Right)  Patient Location: PACU  Anesthesia Type:General  Level of Consciousness: awake  Airway & Oxygen Therapy: Patient Spontanous Breathing and Patient connected to face mask oxygen  Post-op Assessment: Report given to PACU RN and Post -op Vital signs reviewed and stable  Post vital signs: Reviewed and stable  Complications: No apparent anesthesia complications

## 2012-07-04 NOTE — Op Note (Signed)
Preoperative diagnosis: BI-RADS 4 right breast microcalcifications Postoperative diagnosis: Same as above Procedure: Right breast wire localized excisional biopsy Surgeon: Dr. Harden Mo Anesthesia: Gen. With LMA Specimens: Right breast marked short stitch superior, long stitch lateral, double stitch deep Complications: None Estimated blood loss: Minimal Drains: None Sponge and needle count correct x2 at end of operation Disposition to recovery in stable condition  Indications: This is a 47 year-old female who underwent mammography with the finding of a BI-RADS 4 right breast microcalcification. This was not amenable to stereotactic biopsy. She was then referred to me and we discussed all of her options including observation with six-month follow-up versus a biopsy. We have elected to proceed with an excisional biopsy with wire guidance.  Procedure: After informed consent was obtained the patient first had a wire placed at the breast center. I had these mammograms in the operating room. She was taken to the operating room. She was administered 2 g of intravenous cefazolin. Sequential compression devices were on her legs. She was placed under general anesthesia with an LMA. Her right breast was then prepped and draped in the standard sterile surgical fashion. A surgical timeout was then performed.  A wire was about 5 cm away from her nipple areolar complex and 7 cm away from the hook to the entrance site. I ended up making a periareolar incision the superior aspect of the nipple areolar complex and dissected down to find the tip of the wire. I pulled the wire in from its remote position. I then excised the hook of the wire and the surrounding tissue only. This was then passed off the table as a specimen and marked. Faxitron mammogram confirmed removal of the wire as well as the intended area. Hemostasis was then obtained. I closed her deep breast tissue a 2-0 Vicryl. The dermis was closed with 3-0  Vicryl and the skin with 5-0 Monocryl. I infiltrated 10 cc of Marcaine. I then placed Dermabond and Steri-Strips over her incision. She tolerated this well was extubated and transferred to the recovery room stable.

## 2012-07-04 NOTE — Interval H&P Note (Signed)
History and Physical Interval Note:  07/04/2012 10:45 AM  Sara Davidson  has presented today for surgery, with the diagnosis of remove right breast abnormality  The various methods of treatment have been discussed with the patient and family. After consideration of risks, benefits and other options for treatment, the patient has consented to  Procedure(s): Right Breast Wire Localized Excision (Right) as a surgical intervention .  The patient's history has been reviewed, patient examined, no change in status, stable for surgery.  I have reviewed the patient's chart and labs.  Questions were answered to the patient's satisfaction.     [name]

## 2012-07-04 NOTE — Anesthesia Postprocedure Evaluation (Signed)
  Anesthesia Post-op Note  Patient: Sara Davidson  Procedure(s) Performed: Procedure(s): Right Breast Wire Localized Excision (Right)  Patient Location: PACU  Anesthesia Type:General  Level of Consciousness: awake, alert  and oriented  Airway and Oxygen Therapy: Patient Spontanous Breathing and Patient connected to face mask oxygen  Post-op Pain: moderate  Post-op Assessment: Post-op Vital signs reviewed, Patient's Cardiovascular Status Stable, Respiratory Function Stable, Patent Airway and No signs of Nausea or vomiting  Post-op Vital Signs: Reviewed and stable  Complications: No apparent anesthesia complications

## 2012-07-04 NOTE — H&P (View-Only) (Signed)
Patient ID: Sara Davidson, female   DOB: [birth year], 47 y.o.   MRN: [id number]  Chief Complaint  Patient presents with  . New Evaluation    eval Rt br calcification    HPI Sara Davidson is a 47 y.o. female.  Referred by Dr. Anselmo Pickler HPI This is a 20 her old female who is otherwise healthy. She has a family history of breast cancer her mother in 2012. She has no history referable to either breast. She underwent a screening mammogram recently which showed an indeterminate date area of calcifications in the upper right breast measuring 3 mm. This is a by rads 4 mammogram. She was recommended for stereotactic biopsy. These were not able to be adequately visualized for stereotactic biopsy. She was then referred for evaluation for surgical excision of these areas. She comes in today to discuss surgery as well as possible follow up with six-month mammography. She has no complaints with either breast today. History reviewed. No pertinent past medical history.  Past Surgical History  Procedure Laterality Date  . Knee arthroscopy w/ acl reconstruction  4098,1191    Left  . Hernia repair  2011    UMB    Family History  Problem Relation Age of Onset  . Cancer Mother     Breast  . Bipolar disorder Father     Social History History  Substance Use Topics  . Smoking status: Never Smoker   . Smokeless tobacco: Never Used  . Alcohol Use: Yes     Comment: 2/day.    Allergies  Allergen Reactions  . Sulfa Antibiotics Hives    Current Outpatient Prescriptions  Medication Sig Dispense Refill  . buPROPion (WELLBUTRIN XL) 150 MG 24 hr tablet Take 150 mg by mouth daily.      . calcium carbonate 200 MG capsule Take 250 mg by mouth 2 (two) times daily with a meal.      . citalopram (CELEXA) 10 MG tablet Take 10 mg by mouth daily.      Marland Kitchen co-enzyme Q-10 30 MG capsule Take 30 mg by mouth 3 (three) times daily.      . fish oil-omega-3 fatty acids 1000 MG capsule Take 2 g by mouth  daily.      Marland Kitchen lamoTRIgine (LAMICTAL) 150 MG tablet Take 150 mg by mouth daily.      . Multiple Vitamins-Minerals (MULTIVITAMIN WITH MINERALS) tablet Take 1 tablet by mouth daily.       No current facility-administered medications for this visit.    Review of Systems Review of Systems  Constitutional: Negative for fever, chills and unexpected weight change.  HENT: Negative for hearing loss, congestion, sore throat, trouble swallowing and voice change.   Eyes: Negative for visual disturbance.  Respiratory: Negative for cough and wheezing.   Cardiovascular: Negative for chest pain, palpitations and leg swelling.  Gastrointestinal: Negative for nausea, vomiting, abdominal pain, diarrhea, constipation, blood in stool, abdominal distention and anal bleeding.  Genitourinary: Negative for hematuria, vaginal bleeding and difficulty urinating.  Musculoskeletal: Negative for arthralgias.  Skin: Negative for rash and wound.  Neurological: Negative for seizures, syncope and headaches.  Hematological: Negative for adenopathy. Does not bruise/bleed easily.  Psychiatric/Behavioral: Negative for confusion.    Blood pressure 100/70, pulse 79, temperature 98.5 F (36.9 C), temperature source Temporal, resp. rate 18, height 5\' 6"  (1.676 m), weight 134 lb (60.782 kg).  Physical Exam Physical Exam  Vitals reviewed. Constitutional: She appears well-developed and well-nourished.  Neck: Neck supple.  Cardiovascular: Normal rate,  regular rhythm and normal heart sounds.   Pulmonary/Chest: Effort normal and breath sounds normal. She has no wheezes. She has no rales. Right breast exhibits no inverted nipple, no mass, no nipple discharge, no skin change and no tenderness. Left breast exhibits no inverted nipple, no mass, no nipple discharge, no skin change and no tenderness. Breasts are symmetrical.  Lymphadenopathy:    She has no cervical adenopathy.    She has no axillary adenopathy.       Right: No  supraclavicular adenopathy present.       Left: No supraclavicular adenopathy present.    Data Reviewed Following discussion with her husband, the patient would like to  arrange consultation with Dr. Dwain Sarna at [facility]  surgery. This has been arranged for 06/24/2012 and I have  contacted the patient to give her this appointment information.  The patient was encouraged to call the [facility] for additional  questions or concerns.  Addended by: Rolla Plate, M.D. on 06/11/2012 09:20:15.  **END ADDENDUM** SIGNED BY: Rolla Plate, M.D.       Sara Bang Sr., Sara Davidson Mon Jun 10, 2012 4:28:47 PM EST       **ADDENDUM** CREATED: 06/10/2012 16:20:01  The patient returns for possible right breast stereotactic core  biopsy of a very faint calcifications located within the superior  portion of the right breast. Attempts were made to visualize this  faint cluster of calcifications in both the lateral and CC  projections. However, these were not adequate visualized for  stereotactic core biopsy. Additional magnification CC views were  obtained to better demonstrate the calcifications in the CC  projection. These demonstrate a small cluster of calcifications to  be located slightly medially within the central right breast.  There are also scattered punctate benign-appearing calcifications  noted within the superior and medial right breast. I have  discussed options with the patient. I recommend surgical  consultation for possible surgical excisional biopsy of the small  faint cluster of calcifications. If the patient following this  consultation prefers imaging follow-up evaluation, I recommend  right breast diagnostic mammography with magnification views in 6  months.  The patient preferred to talk to her husband prior to arranging  surgical consultation. She will contact the [facility] to help  arrange surgical consultation following this conversation with the   husband.     Assessment    Right breast calcifications not amenable to stereotactic biopsy    Plan    We discussed a 6 month follow-up versus a biopsy. Unfortunately she is not amenable to a stereotactic biopsy. These areas are a BI-RADS 4. She does have a family history her mother of breast cancer which raises her risk as well. We discussed that with a 6 month followup this probably would not be breast cancer but we cannot guarantee her that. I discussed a right breast wire localized excision of these calcifications in the upper right breast. We discussed how that would be performed and the risks associated with it. I did tell them that this was the only way to ensure that this was not a precancerous or cancerous area. It is likely benign but I also recommended excision because of the appearance, this is new, and her family history I recommended excision of these areas. She's going to consider this overnight and will call me back.        WAKEFIELD,MATTHEW 06/24/2012, 3:44 PM

## 2012-07-05 ENCOUNTER — Encounter (HOSPITAL_BASED_OUTPATIENT_CLINIC_OR_DEPARTMENT_OTHER): Payer: Self-pay | Admitting: General Surgery

## 2012-07-13 ENCOUNTER — Telehealth (INDEPENDENT_AMBULATORY_CARE_PROVIDER_SITE_OTHER): Payer: Self-pay | Admitting: Surgery

## 2012-07-13 NOTE — Telephone Encounter (Signed)
S/p lumpectomy Dr Dwain Sarna 07/04/2012.  Pt drove to [facility] for trip.  Pt called c/o worsening yellow bruising/soreness since.  Taking ibuprofen.  Using support bra.  1 steristrip fell off.  Using peroxide daily.  No fevers/chills/sweats.  No drainage.  Not needing narcotics.  I recommended stop NSAIDs & switch to tylenol QID.  Narcotic for pain control.  Decrease activity over the weekend.  Continue support bra.  Add ice/heat/warms showers.  Probable post-op hematoma that will eventually resolve.    If worse, see [place] in office Mon/Tue to see the incision vs ED over the weekend.   O/w keep appt in 6 days.  She feels reassured.

## 2012-07-15 ENCOUNTER — Telehealth (INDEPENDENT_AMBULATORY_CARE_PROVIDER_SITE_OTHER): Payer: Self-pay

## 2012-07-15 NOTE — Telephone Encounter (Signed)
Called pt to check on her since she had to call Dr Michaell Cowing over the weekend b/c concerned with a lot of bruising. The pt stated she felt better after speaking with Dr Michaell Cowing and he advised using a warm heating pad on the right breast. The pt is worried about the incision not looking so good right now so I offered a earlier appt for tomorrow instead of Friday. The pt would like to do the earlier appt.

## 2012-07-16 ENCOUNTER — Ambulatory Visit (INDEPENDENT_AMBULATORY_CARE_PROVIDER_SITE_OTHER): Payer: BC Managed Care – PPO | Admitting: General Surgery

## 2012-07-16 ENCOUNTER — Encounter (INDEPENDENT_AMBULATORY_CARE_PROVIDER_SITE_OTHER): Payer: Self-pay | Admitting: General Surgery

## 2012-07-16 VITALS — BP 120/62 | HR 74 | Temp 97.6°F | Resp 18 | Ht 66.0 in | Wt 131.0 lb

## 2012-07-16 DIAGNOSIS — Z09 Encounter for follow-up examination after completed treatment for conditions other than malignant neoplasm: Secondary | ICD-10-CM

## 2012-07-16 NOTE — Progress Notes (Signed)
Subjective:     Patient ID: Sara Davidson, female   DOB: 1966-01-27, 47 y.o.   MRN: [id number]  HPI 47 yof s/p right breast wire loc biopsy for calcifications not amenable to stereo biopsy. Her path is benign, calcs were present.  She has some pain with increased activity in superior portion of breast and is concerned about her incision.  She is otherwise well.   Review of Systems     Objective:   Physical Exam Right breast incision periareolar healing without infection. Area she is concerned about is the dermabond and ridge at scar    Assessment:     S/p right breast biopsy     Plan:     We discussed her pathology today.  We discussed continued follow up for breast cancer with yearly mmg, clinical exam yearly and monthly sbe.  Her incision should heal just fine and we discussed leaving strips in place.  No more peroxide to be applied.  Once glue and strips come off she will massage with vit e lotion.  i will see back in 3 weeks

## 2012-07-19 ENCOUNTER — Encounter (INDEPENDENT_AMBULATORY_CARE_PROVIDER_SITE_OTHER): Payer: BC Managed Care – PPO | Admitting: General Surgery

## 2012-08-13 ENCOUNTER — Encounter (INDEPENDENT_AMBULATORY_CARE_PROVIDER_SITE_OTHER): Payer: Self-pay | Admitting: General Surgery

## 2012-08-13 ENCOUNTER — Ambulatory Visit (INDEPENDENT_AMBULATORY_CARE_PROVIDER_SITE_OTHER): Payer: BC Managed Care – PPO | Admitting: General Surgery

## 2012-08-13 VITALS — BP 118/62 | HR 67 | Temp 98.7°F | Resp 18 | Ht 66.0 in | Wt 132.0 lb

## 2012-08-13 DIAGNOSIS — Z09 Encounter for follow-up examination after completed treatment for conditions other than malignant neoplasm: Secondary | ICD-10-CM

## 2012-08-13 NOTE — Progress Notes (Signed)
Subjective:     Patient ID: Sara Davidson, female   DOB: Aug 15, 1965, 47 y.o.   MRN: [id number]  HPI This is a 47 yo female is status post an excisional biopsy of a right breast lesion that was not amenable to stereotactic biopsy. Her pathology returned as benign. I saw her immediately  postoperatively tissue as she was concerned about her incision. This appeared fine at that point which is healing. She returns today for followup. She is still concerned somewhat about her incision. She otherwise reports no pain and no other problems. She has no redness or pain at the site. She has no drainage from her incision at all. She's returned all of her normal activities.  Review of Systems     Objective:   Physical Exam Right breast incision at superior areolar border is healing well, still somewhat prominent but no infection, thin    Assessment:     S/p right breast biopsy     Plan:     Her Dermabond is now off. I told her she should begin massaging this with vitamin E cream. I told her that this is a very normal-appearing and she just get better over the next several months. She has a very good result. I told her that she should continue her normal breast cancer screening with yearly mammograms, yearly exams, and monthly self exams. I will see her back as needed.

## 2012-08-13 NOTE — Patient Instructions (Signed)

## 2013-01-15 HISTORY — PX: SHOULDER ARTHROSCOPY: SHX128

## 2013-01-21 ENCOUNTER — Other Ambulatory Visit (HOSPITAL_COMMUNITY): Payer: Self-pay | Admitting: Orthopedic Surgery

## 2013-01-21 DIAGNOSIS — M25512 Pain in left shoulder: Secondary | ICD-10-CM

## 2013-01-22 ENCOUNTER — Ambulatory Visit (HOSPITAL_COMMUNITY)
Admission: RE | Admit: 2013-01-22 | Discharge: 2013-01-22 | Disposition: A | Discharge status: Home / Self Care | Payer: BC Managed Care – PPO | Source: Ambulatory Visit | Attending: Orthopedic Surgery | Admitting: Orthopedic Surgery

## 2013-01-22 DIAGNOSIS — M25512 Pain in left shoulder: Secondary | ICD-10-CM

## 2013-01-22 DIAGNOSIS — M19019 Primary osteoarthritis, unspecified shoulder: Secondary | ICD-10-CM | POA: Insufficient documentation

## 2013-01-22 DIAGNOSIS — M898X9 Other specified disorders of bone, unspecified site: Secondary | ICD-10-CM | POA: Insufficient documentation

## 2013-03-17 HISTORY — PX: FOOT BONE EXCISION: SUR493

## 2013-03-26 ENCOUNTER — Ambulatory Visit (INDEPENDENT_AMBULATORY_CARE_PROVIDER_SITE_OTHER): Payer: BC Managed Care – PPO | Admitting: Podiatry

## 2013-03-26 ENCOUNTER — Encounter: Payer: Self-pay | Admitting: Podiatry

## 2013-03-26 VITALS — BP 109/71 | HR 77 | Resp 16

## 2013-03-26 DIAGNOSIS — M21619 Bunion of unspecified foot: Secondary | ICD-10-CM

## 2013-03-26 DIAGNOSIS — M21611 Bunion of right foot: Secondary | ICD-10-CM

## 2013-03-26 NOTE — Progress Notes (Signed)
Subjective:     Patient ID: Sara Davidson, female   DOB: [birth year], 47 y.o.   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 I allowed her to read consent form for osteotomy with screw fixation and plantar wedge excision and spent a great deal of time going over all possible complications and the fact is no guarantee this will get rid of the corn callus and it may reoccur and give her symptoms again. Understands total recovery period will be 6 months to one year and at this time is dispensed air fracture walker with instructions on usage and is scheduled for outpatient surgery in the next several weeks

## 2013-03-26 NOTE — Patient Instructions (Signed)
Pre-Operative Instructions  Congratulations, you have decided to take an important step to improving your quality of life.  You can be assured that the doctors of [facility] will be with you every step of the way.  1. Plan to be at the surgery center/hospital at least 1 (one) hour prior to your scheduled time unless otherwise directed by the surgical center/hospital staff.  You must have a responsible adult accompany you, remain during the surgery and drive you home.  Make sure you have directions to the surgical center/hospital and know how to get there on time. 2. For hospital based surgery you will need to obtain a history and physical form from your family physician within 1 month prior to the date of surgery- we will give you a form for you primary physician.  3. We make every effort to accommodate the date you request for surgery.  There are however, times where surgery dates or times have to be moved.  We will contact you as soon as possible if a change in schedule is required.   4. No Aspirin/Ibuprofen for one week before surgery.  If you are on aspirin, any non-steroidal anti-inflammatory medications (Mobic, Aleve, Ibuprofen) you should stop taking it 7 days prior to your surgery.  You make take Tylenol  For pain prior to surgery.  5. Medications- If you are taking daily heart and blood pressure medications, seizure, reflux, allergy, asthma, anxiety, pain or diabetes medications, make sure the surgery center/hospital is aware before the day of surgery so they may notify you which medications to take or avoid the day of surgery. 6. No food or drink after midnight the night before surgery unless directed otherwise by surgical center/hospital staff. 7. No alcoholic beverages 24 hours prior to surgery.  No smoking 24 hours prior to or 24 hours after surgery. 8. Wear loose pants or shorts- loose enough to fit over bandages, boots, and casts. 9. No slip on shoes, sneakers are best. 10. Bring  your boot with you to the surgery center/hospital.  Also bring crutches or a walker if your physician has prescribed it for you.  If you do not have this equipment, it will be provided for you after surgery. 11. If you have not been contracted by the surgery center/hospital by the day before your surgery, call to confirm the date and time of your surgery. 12. Leave-time from work may vary depending on the type of surgery you have.  Appropriate arrangements should be made prior to surgery with your employer. 13. Prescriptions will be provided immediately following surgery by your doctor.  Have these filled as soon as possible after surgery and take the medication as directed. 14. Remove nail polish on the operative foot. 15. Wash the night before surgery.  The night before surgery wash the foot and leg well with the antibacterial soap provided and water paying special attention to beneath the toenails and in between the toes.  Rinse thoroughly with water and dry well with a towel.  Perform this wash unless told not to do so by your physician.  Enclosed: 1 Ice pack (please put in freezer the night before surgery)   1 Hibiclens skin cleaner   Pre-op Instructions  If you have any questions regarding the instructions, do not hesitate to call our office.  [place]: [address] [place], [place] [address] [phone]  [place]: [address] [place], [place] [address] [phone]  [employer]: [address]  ,  [address] [phone]  Dr. Richard   Tuchman DPM, Dr. Norman Regal DPM Dr. Richard Sikora DPM, Dr. M. Todd Hyatt DPM, Dr. Kathryn Egerton DPM 

## 2013-04-02 ENCOUNTER — Other Ambulatory Visit: Payer: Self-pay | Admitting: Obstetrics & Gynecology

## 2013-04-02 DIAGNOSIS — R921 Mammographic calcification found on diagnostic imaging of breast: Secondary | ICD-10-CM

## 2013-04-14 ENCOUNTER — Telehealth: Payer: Self-pay | Admitting: *Deleted

## 2013-04-14 NOTE — Telephone Encounter (Addendum)
Pt states had a lumpectomy in early 2014, and a orthopedic scope in 11/2012.  Are there any contradictions to her having anesthesia 04/15/2013?  I will ask Dr Charlsie Merles and call pt again.  DR Charlsie Merles asked Algis Greenhouse to contact pt to inform there should be no problems with the anesthesia of 04/15/2013 conflicting with the anesthesia from the other surgeries.  Algis Greenhouse contacted pt with Dr Beverlee Nims statement.

## 2013-04-15 ENCOUNTER — Encounter: Payer: Self-pay | Admitting: Podiatry

## 2013-04-15 DIAGNOSIS — D492 Neoplasm of unspecified behavior of bone, soft tissue, and skin: Secondary | ICD-10-CM

## 2013-04-15 DIAGNOSIS — M21619 Bunion of unspecified foot: Secondary | ICD-10-CM

## 2013-04-17 HISTORY — PX: INGUINAL HERNIA REPAIR: SUR1180

## 2013-04-21 ENCOUNTER — Ambulatory Visit (INDEPENDENT_AMBULATORY_CARE_PROVIDER_SITE_OTHER): Payer: BC Managed Care – PPO | Admitting: Podiatry

## 2013-04-21 ENCOUNTER — Encounter: Payer: Self-pay | Admitting: Podiatry

## 2013-04-21 ENCOUNTER — Ambulatory Visit (INDEPENDENT_AMBULATORY_CARE_PROVIDER_SITE_OTHER): Payer: BC Managed Care – PPO

## 2013-04-21 VITALS — BP 108/70 | HR 81 | Resp 16

## 2013-04-21 DIAGNOSIS — L84 Corns and callosities: Secondary | ICD-10-CM

## 2013-04-21 DIAGNOSIS — R52 Pain, unspecified: Secondary | ICD-10-CM

## 2013-04-21 DIAGNOSIS — M216X9 Other acquired deformities of unspecified foot: Secondary | ICD-10-CM

## 2013-04-21 NOTE — Progress Notes (Signed)
1.  Elevating osteotomy 5th metatarsal right foot with screw. 2.  Removal skin wedge plantar right foot.

## 2013-04-21 NOTE — Progress Notes (Signed)
° °  Subjective:    Patient ID: Sara Davidson, female    DOB: [birth year], 48 y.o.   MRN: [id number]  HPI foot surgery on 04-15-13 and my foot is good today and I am not taking any more pain medicines    Review of Systems     Objective:   Physical Exam        Assessment & Plan:

## 2013-04-22 NOTE — Progress Notes (Signed)
Subjective:     Patient ID: Sara Davidson, female   DOB: September 27, 1965, 48 y.o.   MRN: [id number]  HPI patient states that she is doing well with her foot surgery and is walking comfortably with mild discomfort upon ambulation. 6 days after foot surgery right   Review of Systems     Objective:   Physical Exam Neurovascular status intact with no health history changes and negative Homans sign noted right. Incision sites are healing well with stitches intact wound edges well coapted and minimal edema no erythema or drainage noted    Assessment:     Healing well from metatarsal osteotomy fifth right and removal of plantar skin wedge right    Plan:     Reviewed x-rays and reapplied sterile dressing. Dispense Darco shoe for home usage and advised on continued immobilization. Reappoint 2 weeks for suture removal earlier if necessary

## 2013-04-28 NOTE — Progress Notes (Signed)
1) Metatarsal osteotomy 5th met right foot  2) Excision lesion right foot

## 2013-05-05 ENCOUNTER — Ambulatory Visit (INDEPENDENT_AMBULATORY_CARE_PROVIDER_SITE_OTHER): Payer: BC Managed Care – PPO

## 2013-05-05 ENCOUNTER — Encounter: Payer: Self-pay | Admitting: Podiatry

## 2013-05-05 ENCOUNTER — Ambulatory Visit (INDEPENDENT_AMBULATORY_CARE_PROVIDER_SITE_OTHER): Payer: BC Managed Care – PPO | Admitting: Podiatry

## 2013-05-05 VITALS — BP 108/69 | HR 68 | Resp 16

## 2013-05-05 DIAGNOSIS — Z9889 Other specified postprocedural states: Secondary | ICD-10-CM

## 2013-05-05 DIAGNOSIS — M21619 Bunion of unspecified foot: Secondary | ICD-10-CM

## 2013-05-05 NOTE — Progress Notes (Signed)
Subjective:     Patient ID: Sara Davidson, female   DOB: July 09, 1965, 48 y.o.   MRN: [id number]  HPI patient presents stating I'm walking pretty well on my right foot. 4 weeks after foot surgery right   Review of Systems     Objective:   Physical Exam Neurovascular status intact with well-healing incision sites dorsal fifth metatarsal and stitches intact plantar right with minimal discomfort    Assessment:     Patient is responding well to surgery with mild edema    Plan:     X-ray reviewed and stitches removed and advice on continued compression elevation and immobilization with gradual return to saw shoe gear usage. Reappoint in 4 weeks

## 2013-05-28 ENCOUNTER — Ambulatory Visit
Admission: RE | Admit: 2013-05-28 | Discharge: 2013-05-28 | Disposition: A | Discharge status: Home / Self Care | Payer: BC Managed Care – PPO | Source: Ambulatory Visit | Attending: Obstetrics & Gynecology | Admitting: Obstetrics & Gynecology

## 2013-05-28 ENCOUNTER — Other Ambulatory Visit: Payer: Self-pay | Admitting: Obstetrics & Gynecology

## 2013-05-28 DIAGNOSIS — R921 Mammographic calcification found on diagnostic imaging of breast: Secondary | ICD-10-CM

## 2013-06-02 ENCOUNTER — Encounter: Payer: Self-pay | Admitting: Podiatry

## 2013-06-02 ENCOUNTER — Other Ambulatory Visit: Payer: Self-pay | Admitting: Obstetrics & Gynecology

## 2013-06-02 ENCOUNTER — Ambulatory Visit (INDEPENDENT_AMBULATORY_CARE_PROVIDER_SITE_OTHER): Payer: BC Managed Care – PPO

## 2013-06-02 ENCOUNTER — Ambulatory Visit (INDEPENDENT_AMBULATORY_CARE_PROVIDER_SITE_OTHER): Payer: BC Managed Care – PPO | Admitting: Podiatry

## 2013-06-02 VITALS — BP 101/64 | HR 79 | Resp 12

## 2013-06-02 DIAGNOSIS — Z9889 Other specified postprocedural states: Secondary | ICD-10-CM

## 2013-06-02 DIAGNOSIS — M21619 Bunion of unspecified foot: Secondary | ICD-10-CM

## 2013-06-02 NOTE — Progress Notes (Signed)
Subjective:     Patient ID: Sara Davidson, female   DOB: [birth year], 48 y.o.   MRN: [id number]  HPI patient states I'm doing fine with swelling of fibrin on my foot for to long. 6 weeks after having met osteotomy fifth right   Review of Systems     Objective:   Physical Exam Neurovascular status intact no health history changes noted with well-healing fifth metatarsal right with mild edema consistent with 6 weeks after surgery    Assessment:     Doing well post osteotomy fifth metatarsal right    Plan:     Reviewed x-rays and allow patient to increase activity but to continue with supportive shoe gear. Reappoint 6 weeks for final visit

## 2013-06-03 NOTE — Progress Notes (Signed)
1) Elevating osteotomy 5th met right foot  2) Removal skin wedge plantar right foot

## 2013-06-05 ENCOUNTER — Encounter: Payer: BC Managed Care – PPO | Admitting: Podiatry

## 2013-07-21 ENCOUNTER — Encounter: Payer: BC Managed Care – PPO | Admitting: Podiatry

## 2013-07-21 ENCOUNTER — Ambulatory Visit (INDEPENDENT_AMBULATORY_CARE_PROVIDER_SITE_OTHER): Payer: BC Managed Care – PPO | Admitting: Podiatry

## 2013-07-21 ENCOUNTER — Ambulatory Visit (INDEPENDENT_AMBULATORY_CARE_PROVIDER_SITE_OTHER): Payer: BC Managed Care – PPO

## 2013-07-21 ENCOUNTER — Encounter: Payer: Self-pay | Admitting: Podiatry

## 2013-07-21 VITALS — BP 99/65 | HR 78 | Resp 16

## 2013-07-21 DIAGNOSIS — M21619 Bunion of unspecified foot: Secondary | ICD-10-CM

## 2013-07-21 DIAGNOSIS — Z9889 Other specified postprocedural states: Secondary | ICD-10-CM

## 2013-07-21 NOTE — Progress Notes (Signed)
Subjective:     Patient ID: Sara Davidson, female   DOB: [birth year], 48 y.o.   MRN: [id number]  HPI patient states I'm doing well with my right foot and still experiencing mild numbness but minimal discomfort   Review of Systems     Objective:   Physical Exam Neurovascular status intact with well-healed surgical site right fifth metatarsal and plantar right fifth metatarsal    Assessment:     Doing well post osteotomy fifth metatarsal right    Plan:     Reviewed final x-rays and advised on return to all normal shoe gear and activities reappoint as needed if symptoms were to occur

## 2013-08-21 ENCOUNTER — Encounter (INDEPENDENT_AMBULATORY_CARE_PROVIDER_SITE_OTHER): Payer: Self-pay | Admitting: General Surgery

## 2013-08-21 ENCOUNTER — Ambulatory Visit (INDEPENDENT_AMBULATORY_CARE_PROVIDER_SITE_OTHER): Payer: BC Managed Care – PPO | Admitting: General Surgery

## 2013-08-21 VITALS — BP 120/80 | HR 80 | Temp 97.6°F | Resp 14 | Ht 66.0 in | Wt 132.2 lb

## 2013-08-21 DIAGNOSIS — K4090 Unilateral inguinal hernia, without obstruction or gangrene, not specified as recurrent: Secondary | ICD-10-CM

## 2013-08-21 NOTE — Progress Notes (Signed)
Patient ID: Sara Davidson, female   DOB: 10/02/1965, 48 y.o.   MRN: [id number]  Chief Complaint  Patient presents with  . New Evaluation    cyst on leg    HPI Sara Davidson is a 48 y.o. female.   HPI  She is referred by Dr. Vania Rea for further evaluation of a right groin lump. She was told she had a right inguinal hernia many years ago during her pregnancy. Recently she's noticed a lump in her right groin that sometimes is larger than others. No significant discomfort. She is very active. No chronic constipation. No difficulty with urination. She had a previous umbilical hernia repair. She is unaware of any family history of inguinal hernia repair.  She is here with her husband.  Past Medical History  Diagnosis Date  . Depression   . Anxiety   . Contact lens/glasses fitting     wears contacts or glasses    Past Surgical History  Procedure Laterality Date  . Knee arthroscopy w/ acl reconstruction  2423,5361    Left  . Hernia repair  2011    UMB  . Lipoma excision  2008    lt forearm  . Cervical conization w/bx  2004  . Breast biopsy Right 07/04/2012    Procedure: Right Breast Wire Localized Excision;  Surgeon: Rolm Bookbinder, MD;  Location: [place];  Service: General;  Laterality: Right;  . Metatarsal osteotomy    . Lesion excision Right     foot    Family History  Problem Relation Age of Onset  . Cancer Mother     Breast  . Bipolar disorder Father     Social History History  Substance Use Topics  . Smoking status: Never Smoker   . Smokeless tobacco: Never Used  . Alcohol Use: Yes     Comment: 2/day.    Allergies  Allergen Reactions  . Sulfa Antibiotics Hives    Current Outpatient Prescriptions  Medication Sig Dispense Refill  . b complex vitamins tablet Take 1 tablet by mouth daily.      Marland Kitchen buPROPion (WELLBUTRIN XL) 150 MG 24 hr tablet Take 150 mg by mouth daily.      . calcium carbonate 200 MG capsule Take 250 mg by  mouth 2 (two) times daily with a meal.      . citalopram (CELEXA) 20 MG tablet       . co-enzyme Q-10 30 MG capsule Take 30 mg by mouth 3 (three) times daily.      . Coenzyme Q10 (CO Q-10 PO) Take by mouth.      . lamoTRIgine (LAMICTAL) 150 MG tablet Take 150 mg by mouth daily.      . Multiple Vitamins-Minerals (MULTIVITAMIN WITH MINERALS) tablet Take 1 tablet by mouth daily.       No current facility-administered medications for this visit.    Review of Systems Review of Systems  Constitutional: Negative.   Respiratory: Negative.   Cardiovascular: Negative.   Gastrointestinal: Negative.   Genitourinary: Negative.   Neurological: Positive for headaches.  Hematological: Negative.     Blood pressure 120/80, pulse 80, temperature 97.6 F (36.4 C), temperature source Temporal, resp. rate 14, height 5\' 6"  (1.676 m), weight 132 lb 3.2 oz (59.966 kg).  Physical Exam Physical Exam  Constitutional: She appears well-developed and well-nourished. No distress.  HENT:  Head: Normocephalic and atraumatic.  Eyes: EOM are normal.  Cardiovascular: Normal rate and regular rhythm.   Pulmonary/Chest: Effort normal and breath  sounds normal.  Abdominal: Soft. She exhibits no mass. There is no tenderness.  Small periumbilical scar  Genitourinary:  Reducible right groin bulge. Not present in the supine position. Left inguinal floor is solid.  Musculoskeletal:  Lower extremity and foot scars.  Neurological: She is alert.  Skin: Skin is warm and dry.  Psychiatric: She has a normal mood and affect. Her behavior is normal.    Data Reviewed None  Assessment    Reducible right inguinal hernia.     Plan    I recommended she consider open right inguinal hernia repair with mesh. We also talked about On-Q pain pump placement. She is interested in this.   I have explained the procedure, risks, and aftercare of inguinal hernia repair.  Risks include but are not limited to bleeding, infection,  wound problems, anesthesia, recurrence, bladder or intestine injury, urinary retention, testicular dysfunction, chronic pain, mesh problems.  She seems to understand and agrees to proceed.       Rhunette Croft Kerah Hardebeck 08/21/2013, 10:47 AM

## 2013-08-21 NOTE — Patient Instructions (Signed)
[facility] _______[facility]  UMBILICAL OR INGUINAL HERNIA REPAIR: POST OP INSTRUCTIONS  Always review your discharge instruction sheet given to you by the facility where your surgery was performed. IF YOU HAVE DISABILITY OR FAMILY LEAVE FORMS, YOU MUST BRING THEM TO THE OFFICE FOR PROCESSING.   DO NOT GIVE THEM TO YOUR DOCTOR.  1. A  prescription for pain medication may be given to you upon discharge.  Take your pain medication as prescribed, if needed.  If narcotic pain medicine is not needed, then you may take acetaminophen (Tylenol) or ibuprofen (Advil) as needed. 2. Take your usually prescribed medications unless otherwise directed. 3. If you need a refill on your pain medication, please contact your pharmacy.  They will contact our office to request authorization. Prescriptions will not be filled after 5 pm or on week-ends. 4. You should follow a light diet the first 24 hours after arrival home, such as soup and crackers, etc.  Be sure to include lots of fluids daily.  Resume your normal diet the day after surgery. 5. Most patients will experience some swelling and bruising around the umbilicus or in the groin and scrotum.  Ice packs and reclining will help.  Swelling and bruising can take several days to resolve.  6. It is common to experience some constipation if taking pain medication after surgery.  Increasing fluid intake and taking a stool softener (such as Colace) will usually help or prevent this problem from occurring.  A mild laxative (Milk of Magnesia or Miralax) should be taken according to package directions if there are no bowel movements after 48 hours. 7. Unless discharge instructions indicate otherwise, you may remove your bandages 50 hours after surgery, and you may shower at that time.  You may have steri-strips (small skin tapes) in place directly over the incision.  These strips should be left on the skin for 7-10 days.  If your surgeon used skin glue on the  incision, you may shower in 24 hours.  The glue will flake off over the next 2-3 weeks.  Any sutures or staples will be removed at the office during your follow-up visit. 8. ACTIVITIES:  You may resume regular (light) daily activities beginning the next day-such as daily self-care, walking, climbing stairs-gradually increasing activities as tolerated.  You may have sexual intercourse when it is comfortable.  Refrain from any heavy lifting or straining until approved by your doctor. a. You may drive when you are no longer taking prescription pain medication, you can comfortably wear a seatbelt, and you can safely maneuver your car and apply brakes. b. RETURN TO WORK:  __________________________________________________________ 9. You should see your doctor in the office for a follow-up appointment approximately 2-3 weeks after your surgery.  Make sure that you call for this appointment within a day or two after you arrive home to insure a convenient appointment time. 10. OTHER INSTRUCTIONS:  __________________________________________________________________________________________________________________________________________________________________________________________  WHEN TO CALL YOUR DOCTOR: 1. Fever over 101.0 2. Inability to urinate 3. Nausea and/or vomiting 4. Extreme swelling or bruising 5. Continued bleeding from incision. 6. Increased pain, redness, or drainage from the incision  The clinic staff is available to answer your questions during regular business hours.  Please don't hesitate to call and ask to speak to one of the nurses for clinical concerns.  If you have a medical emergency, go to the nearest emergency room or call 911.  A surgeon from [facility] is always on call at the hospital  [address], [place], [place], [place]  [address] ?  [address], [place], [place]   [address] [phone] ? [phone] ? FAX [phone] Web site:  [email or web address]

## 2013-09-01 ENCOUNTER — Other Ambulatory Visit (INDEPENDENT_AMBULATORY_CARE_PROVIDER_SITE_OTHER): Payer: Self-pay

## 2013-09-01 DIAGNOSIS — K4090 Unilateral inguinal hernia, without obstruction or gangrene, not specified as recurrent: Secondary | ICD-10-CM

## 2013-09-01 MED ORDER — OXYCODONE HCL 5 MG PO TABS: 5.0000 mg | ORAL_TABLET | ORAL | Status: DC | PRN

## 2013-09-02 ENCOUNTER — Ambulatory Visit (INDEPENDENT_AMBULATORY_CARE_PROVIDER_SITE_OTHER): Payer: BC Managed Care – PPO | Admitting: General Surgery

## 2013-09-02 ENCOUNTER — Encounter (INDEPENDENT_AMBULATORY_CARE_PROVIDER_SITE_OTHER): Payer: Self-pay | Admitting: General Surgery

## 2013-09-02 DIAGNOSIS — Z4889 Encounter for other specified surgical aftercare: Secondary | ICD-10-CM

## 2013-09-02 NOTE — Patient Instructions (Addendum)
Continue previous instructions. May shower tomorrow and remove bandage. Replace bandage as needed.  Start using Colace twice a day.

## 2013-09-02 NOTE — Progress Notes (Signed)
Procedure:  Right inguinal hernia repair with mesh an On-Q pump placement  Date:  09/01/2013  Pathology:  na  History:  She comes in today because of some light but the drainage is coming through the bandage. She has not had much pain.  Exam: General- Is in NAD. Right groin-there is some thin serosanguineous fluid leaking around the On Q catheter site and saturating the dressing. The dressing was sterilely removed and the On-Q pump was removed. No active bleeding coming from the incision. Minimal swelling around the incision. A bulky sterile dressing was applied.  Assessment:  Status post right inguinal hernia  Leakage around the On-Q catheter insertion site. Catheter has been removed. No active bleeding from the hernia incision there  Plan:  Continue light activities and ice and all previous instructions. May shower and remove bandage tomorrow. Return visit 2 weeks.

## 2013-09-16 ENCOUNTER — Ambulatory Visit (INDEPENDENT_AMBULATORY_CARE_PROVIDER_SITE_OTHER): Payer: BC Managed Care – PPO | Admitting: General Surgery

## 2013-09-16 ENCOUNTER — Encounter (INDEPENDENT_AMBULATORY_CARE_PROVIDER_SITE_OTHER): Payer: Self-pay | Admitting: General Surgery

## 2013-09-16 VITALS — BP 126/70 | HR 67 | Temp 97.0°F | Ht 66.0 in | Wt 133.0 lb

## 2013-09-16 DIAGNOSIS — Z4889 Encounter for other specified surgical aftercare: Secondary | ICD-10-CM

## 2013-09-16 NOTE — Progress Notes (Signed)
Procedure:  Right inguinal hernia repair with mesh an On-Q pump placement  Date:  09/01/2013  Pathology:  na  History:  She is here for her second postoperative visit and is doing well. Does have some discomfort at the end of the day.  Exam: General- Is in NAD. Right groin-Incision is clean, intact, and repair feels solid. Assessment:  Status post right inguinal hernia repair with mesh-doing well.  Plan:   Continue light activities for now. 6 weeks from the date of surgery resume normal activities as tolerated. Return visit as needed.

## 2013-09-16 NOTE — Patient Instructions (Signed)
6 weeks from the date of surgery, may resume normal activities as tolerated, as discussed.

## 2013-10-15 ENCOUNTER — Encounter (INDEPENDENT_AMBULATORY_CARE_PROVIDER_SITE_OTHER): Payer: Self-pay | Admitting: General Surgery

## 2013-10-15 NOTE — Progress Notes (Signed)
Patient ID: Sara Davidson, female   DOB: [birth year], 48 y.o.   MRN: [id number] She called with some questions about activity level. She is going on a trip to [place] and wondered what she would be able to do. I told her I felt she could do all activities as long as there is no discomfort or pain. Also, she got a bill for the On-Q pump that malfunctioned.  I advised her to run it back through her insurance company and also speak with the company that billed for the pump.

## 2013-10-16 ENCOUNTER — Telehealth (INDEPENDENT_AMBULATORY_CARE_PROVIDER_SITE_OTHER): Payer: Self-pay

## 2013-10-16 NOTE — Telephone Encounter (Signed)
Left message for Rhonda.

## 2013-10-16 NOTE — Telephone Encounter (Signed)
Suanne Marker from the [facility] states that pt has contacted them stating that she received a bill for a Q pump. Pt states that the Q pump has malfunctioned. Q pump was removed on 09/02/13 by Dr Zella Richer. They wanted to know if Dr Zella Richer thought that the Q pump had malfunctioned. Suanne Marker would like someone to contact her back whenever we get with Dr Zella Richer @ [phone] ext [phone] or fax over something stating what he feels [phone]

## 2013-11-03 ENCOUNTER — Encounter (INDEPENDENT_AMBULATORY_CARE_PROVIDER_SITE_OTHER): Payer: BC Managed Care – PPO | Admitting: General Surgery

## 2014-02-10 ENCOUNTER — Other Ambulatory Visit: Payer: Self-pay | Admitting: Obstetrics & Gynecology

## 2014-02-10 DIAGNOSIS — N644 Mastodynia: Secondary | ICD-10-CM

## 2014-02-23 ENCOUNTER — Ambulatory Visit
Admission: RE | Admit: 2014-02-23 | Discharge: 2014-02-23 | Disposition: A | Discharge status: Home / Self Care | Payer: BC Managed Care – PPO | Source: Ambulatory Visit | Attending: Obstetrics & Gynecology | Admitting: Obstetrics & Gynecology

## 2014-02-23 DIAGNOSIS — N644 Mastodynia: Secondary | ICD-10-CM

## 2014-03-23 ENCOUNTER — Telehealth (INDEPENDENT_AMBULATORY_CARE_PROVIDER_SITE_OTHER): Payer: Self-pay

## 2014-03-23 NOTE — Telephone Encounter (Signed)
Can order the MRI.

## 2014-03-23 NOTE — Telephone Encounter (Signed)
Pt requesting order for MRI.  She has been to PT, but is complaining of pelvic bone tenderness.  She has met her insurance deductible for this year.  Please advise.

## 2014-03-24 ENCOUNTER — Other Ambulatory Visit (INDEPENDENT_AMBULATORY_CARE_PROVIDER_SITE_OTHER): Payer: Self-pay | Admitting: General Surgery

## 2014-03-24 DIAGNOSIS — R109 Unspecified abdominal pain: Secondary | ICD-10-CM

## 2014-03-24 NOTE — Telephone Encounter (Signed)
Pt will call Sara Davidson and schedule her MRI.  Will notify her of results.

## 2014-04-03 ENCOUNTER — Ambulatory Visit (HOSPITAL_COMMUNITY)
Admission: RE | Admit: 2014-04-03 | Discharge: 2014-04-03 | Disposition: A | Discharge status: Home / Self Care | Payer: BC Managed Care – PPO | Source: Ambulatory Visit | Attending: General Surgery | Admitting: General Surgery

## 2014-04-03 ENCOUNTER — Telehealth (INDEPENDENT_AMBULATORY_CARE_PROVIDER_SITE_OTHER): Payer: Self-pay

## 2014-04-03 ENCOUNTER — Other Ambulatory Visit (INDEPENDENT_AMBULATORY_CARE_PROVIDER_SITE_OTHER): Payer: Self-pay | Admitting: General Surgery

## 2014-04-03 DIAGNOSIS — R102 Pelvic and perineal pain: Secondary | ICD-10-CM | POA: Diagnosis present

## 2014-04-03 DIAGNOSIS — R109 Unspecified abdominal pain: Secondary | ICD-10-CM

## 2014-04-03 MED ORDER — GADOBENATE DIMEGLUMINE 529 MG/ML IV SOLN
12.0000 mL | Freq: Once | INTRAVENOUS | Status: AC | PRN
  Administered 2014-04-03: 12 mL via INTRAVENOUS

## 2014-04-03 NOTE — Telephone Encounter (Signed)
Patient has be notified

## 2014-04-03 NOTE — Telephone Encounter (Signed)
MRI is normal.

## 2014-04-03 NOTE — Telephone Encounter (Signed)
Pt was told by imaging center that the results of her MRI would be available today. Pt called the office to ask if you could read the results and call her back today. I explained to the patient that you were in surgery most of the day but that I would send you a message. Pt verbalized understanding.

## 2014-06-08 ENCOUNTER — Other Ambulatory Visit: Payer: Self-pay | Admitting: Obstetrics & Gynecology

## 2014-06-09 LAB — CYTOLOGY - PAP

## 2014-08-28 ENCOUNTER — Ambulatory Visit (INDEPENDENT_AMBULATORY_CARE_PROVIDER_SITE_OTHER): Payer: BLUE CROSS/BLUE SHIELD | Admitting: Family Medicine

## 2014-08-28 ENCOUNTER — Encounter: Payer: Self-pay | Admitting: Family Medicine

## 2014-08-28 VITALS — BP 108/72 | HR 77 | Temp 98.8°F | Resp 16 | Ht 65.7 in | Wt 137.4 lb

## 2014-08-28 DIAGNOSIS — L0231 Cutaneous abscess of buttock: Secondary | ICD-10-CM | POA: Diagnosis not present

## 2014-08-28 MED ORDER — DOXYCYCLINE HYCLATE 100 MG PO CAPS: 100.0000 mg | ORAL_CAPSULE | Freq: Two times a day (BID) | ORAL | Status: DC

## 2014-08-28 NOTE — Progress Notes (Signed)
Subjective:    Patient ID: Sara Davidson, female    DOB: Dec 08, 1965, 49 y.o.   MRN: [id number] This chart was scribed for Delman Cheadle, MD by Zola Button, Medical Scribe. This patient was seen in Room 26 and the patient's care was started at 3:12 PM.   Chief Complaint  Patient presents with  . lump on panty line    HPI HPI Comments: Sara Davidson is a 49 y.o. female who presents to the [facility] complaining of an abscess to her right perineum area that she first noticed a few days ago. She has tried preparation H for this.   PSHx includes umbilical hernia repair a few years ago and inguinal surgery repair more recently, about a year ago.  Past Medical History  Diagnosis Date  . Depression   . Anxiety   . Contact lens/glasses fitting     wears contacts or glasses   Current Outpatient Prescriptions on File Prior to Visit  Medication Sig Dispense Refill  . b complex vitamins tablet Take 1 tablet by mouth daily.    Marland Kitchen buPROPion (WELLBUTRIN XL) 150 MG 24 hr tablet Take 150 mg by mouth daily.    . calcium carbonate 200 MG capsule Take 250 mg by mouth 2 (two) times daily with a meal.    . citalopram (CELEXA) 20 MG tablet     . co-enzyme Q-10 30 MG capsule Take 30 mg by mouth 3 (three) times daily.    Marland Kitchen lamoTRIgine (LAMICTAL) 150 MG tablet Take 150 mg by mouth daily.    . Multiple Vitamins-Minerals (MULTIVITAMIN WITH MINERALS) tablet Take 1 tablet by mouth daily.     No current facility-administered medications on file prior to visit.   Allergies  Allergen Reactions  . Sulfa Antibiotics Hives     Review of Systems  Constitutional: Negative for fever, chills and diaphoresis.  Gastrointestinal: Negative for nausea, vomiting, abdominal pain, diarrhea, constipation, blood in stool, abdominal distention, anal bleeding and rectal pain.  Genitourinary: Negative for dysuria, urgency, frequency, hematuria, decreased urine volume, vaginal bleeding, vaginal  discharge, difficulty urinating, genital sores, vaginal pain, menstrual problem and pelvic pain.  Musculoskeletal: Positive for myalgias. Negative for back pain, joint swelling, arthralgias and gait problem.  Skin: Positive for color change and rash. Negative for pallor and wound.  Neurological: Negative for weakness and numbness.  Hematological: Negative for adenopathy. Does not bruise/bleed easily.  Psychiatric/Behavioral: Positive for sleep disturbance.       Objective:  BP 108/72 mmHg  Pulse 77  Temp(Src) 98.8 F (37.1 C) (Oral)  Resp 16  Ht 5' 5.7" (1.669 m)  Wt 137 lb 6.4 oz (62.324 kg)  BMI 22.37 kg/m2  SpO2 98%  LMP 08/08/2014 (Approximate)  Physical Exam  Constitutional: She is oriented to person, place, and time. She appears well-developed and well-nourished. No distress.  HENT:  Head: Normocephalic and atraumatic.  Mouth/Throat: Oropharynx is clear and moist. No oropharyngeal exudate.  Eyes: Pupils are equal, round, and reactive to light.  Neck: Neck supple.  Cardiovascular: Normal rate.   Pulmonary/Chest: Effort normal.  Musculoskeletal: She exhibits no edema.  Neurological: She is alert and oriented to person, place, and time. No cranial nerve deficit.  Skin: Skin is warm and dry. No rash noted.  Right distal perineum: small, 1 cm diameter, erythematous, tender nodule with tiny pinpoint pustule. Well-defined, mobile with small amount of induration on the abscess that feels approximately 1 cm deep as well with no surrounding induration. No central  fluctuance.  Psychiatric: She has a normal mood and affect. Her behavior is normal.  Vitals reviewed.         Assessment & Plan:  Abscess - to small to I&D today - warm wet compresses, sit baths, and start doxy - rehceck in 48 hjrs to ensure improving and that I&D is not today.  No h/o prior.  Meds ordered this encounter  Medications  . doxycycline (VIBRAMYCIN) 100 MG capsule    Sig: Take 1 capsule (100 mg total)  by mouth 2 (two) times daily.    Dispense:  20 capsule    Refill:  0    I personally performed the services described in this documentation, which was scribed in my presence. The recorded information has been reviewed and considered, and addended by me as needed.  Delman Cheadle, MD MPH

## 2014-08-28 NOTE — Patient Instructions (Signed)
Abscess An abscess is an infected area that contains a collection of pus and debris.It can occur in almost any part of the body. An abscess is also known as a furuncle or boil. CAUSES  An abscess occurs when tissue gets infected. This can occur from blockage of oil or sweat glands, infection of hair follicles, or a minor injury to the skin. As the body tries to fight the infection, pus collects in the area and creates pressure under the skin. This pressure causes pain. People with weakened immune systems have difficulty fighting infections and get certain abscesses more often.  SYMPTOMS Usually an abscess develops on the skin and becomes a painful mass that is red, warm, and tender. If the abscess forms under the skin, you may feel a moveable soft area under the skin. Some abscesses break open (rupture) on their own, but most will continue to get worse without care. The infection can spread deeper into the body and eventually into the bloodstream, causing you to feel ill.  DIAGNOSIS  Your caregiver will take your medical history and perform a physical exam. A sample of fluid may also be taken from the abscess to determine what is causing your infection. TREATMENT  Your caregiver may prescribe antibiotic medicines to fight the infection. However, taking antibiotics alone usually does not cure an abscess. Your caregiver may need to make a small cut (incision) in the abscess to drain the pus. In some cases, gauze is packed into the abscess to reduce pain and to continue draining the area. HOME CARE INSTRUCTIONS   Only take over-the-counter or prescription medicines for pain, discomfort, or fever as directed by your caregiver.  If you were prescribed antibiotics, take them as directed. Finish them even if you start to feel better.  If gauze is used, follow your caregiver's directions for changing the gauze.  To avoid spreading the infection:  Keep your draining abscess covered with a  bandage.  Wash your hands well.  Do not share personal care items, towels, or whirlpools with others.  Avoid skin contact with others.  Keep your skin and clothes clean around the abscess.  Keep all follow-up appointments as directed by your caregiver. SEEK MEDICAL CARE IF:   You have increased pain, swelling, redness, fluid drainage, or bleeding.  You have muscle aches, chills, or a general ill feeling.  You have a fever. MAKE SURE YOU:   Understand these instructions.  Will watch your condition.  Will get help right away if you are not doing well or get worse. Document Released: 01/11/2005 Document Revised: 10/03/2011 Document Reviewed: 06/16/2011 [facility] Patient Information 2015 [place], [place]. This information is not intended to replace advice given to you by your health care provider. Make sure you discuss any questions you have with your health care provider.  Epidermal Cyst An epidermal cyst is sometimes called a sebaceous cyst, epidermal inclusion cyst, or infundibular cyst. These cysts usually contain a substance that looks "pasty" or "cheesy" and may have a bad smell. This substance is a protein called keratin. Epidermal cysts are usually found on the face, neck, or trunk. They may also occur in the vaginal area or other parts of the genitalia of both men and women. Epidermal cysts are usually small, painless, slow-growing bumps or lumps that move freely under the skin. It is important not to try to pop them. This may cause an infection and lead to tenderness and swelling. CAUSES  Epidermal cysts may be caused by a deep penetrating injury  to the skin or a plugged hair follicle, often associated with acne. SYMPTOMS  Epidermal cysts can become inflamed and cause:  Redness.  Tenderness.  Increased temperature of the skin over the bumps or lumps.  Grayish-white, bad smelling material that drains from the bump or lump. DIAGNOSIS  Epidermal cysts are easily diagnosed  by your caregiver during an exam. Rarely, a tissue sample (biopsy) may be taken to rule out other conditions that may resemble epidermal cysts. TREATMENT   Epidermal cysts often get better and disappear on their own. They are rarely ever cancerous.  If a cyst becomes infected, it may become inflamed and tender. This may require opening and draining the cyst. Treatment with antibiotics may be necessary. When the infection is gone, the cyst may be removed with minor surgery.  Small, inflamed cysts can often be treated with antibiotics or by injecting steroid medicines.  Sometimes, epidermal cysts become large and bothersome. If this happens, surgical removal in your caregiver's office may be necessary. HOME CARE INSTRUCTIONS  Only take over-the-counter or prescription medicines as directed by your caregiver.  Take your antibiotics as directed. Finish them even if you start to feel better. SEEK MEDICAL CARE IF:   Your cyst becomes tender, red, or swollen.  Your condition is not improving or is getting worse.  You have any other questions or concerns. MAKE SURE YOU:  Understand these instructions.  Will watch your condition.  Will get help right away if you are not doing well or get worse. Document Released: 03/04/2004 Document Revised: 06/26/2011 Document Reviewed: 10/10/2010 [facility] Patient Information 2015 [place], [place]. This information is not intended to replace advice given to you by your health care provider. Make sure you discuss any questions you have with your health care provider.

## 2014-08-30 ENCOUNTER — Ambulatory Visit (INDEPENDENT_AMBULATORY_CARE_PROVIDER_SITE_OTHER): Payer: BLUE CROSS/BLUE SHIELD | Admitting: Family Medicine

## 2014-08-30 VITALS — BP 112/82 | HR 67 | Temp 97.7°F | Resp 16 | Ht 65.7 in | Wt 139.2 lb

## 2014-08-30 DIAGNOSIS — L0231 Cutaneous abscess of buttock: Secondary | ICD-10-CM

## 2014-08-30 NOTE — Progress Notes (Signed)
Subjective:  This chart was scribed for Delman Cheadle MD, by Tamsen Roers, at [facility].  This patient was seen in room 4 and the patient's care was started at 11:45 AM.    Patient ID: Sara Davidson, female    DOB: 1965-04-27, 49 y.o.   MRN: [id number] Chief Complaint  Patient presents with  . Follow-up    return visit to see Dr. Brigitte Pulse    HPI HPI Comments: Sara Davidson is a 49 y.o. female who presents to the [facility] for a recheck form office visit two days prior where she was found to have a right perineal abscess on her proximal gluteal thigh region.  She had no prior history of any abscesses or MRSA.  Unclear if it was abscess versus infected sebaceous cyst.  Though suspected the latter on exam.  At that point, the abscess was aprox. 1 cm diameter with no surrounding signs of inflammation or speaking infection. So patient was started on doxycycline and advised frequent warm wet compresses and baths. She was requested to return to [facility] today for recheck since an I and D was not performed.   Since her last visit 48 hours ago, she thinks the abscess is unchanged but notes that she has started her menses with some severe dismonerreah as well as migraine headache and nausea due to the doxycycline.  Patients husband is with her and he has a very atypical Sjogren's followed by rheumatology at [facility].  He really likes his rheumatologist as she is providing excellent care and has recently changed association to be with [facility]. He has a GP in [place] who he likes but is having trouble traveling that far for minor issues so he is requesting to transfer PCP to me which is fine.    Past Medical History  Diagnosis Date  . Depression   . Anxiety   . Contact lens/glasses fitting     wears contacts or glasses   Current Outpatient Prescriptions on File Prior to Visit  Medication Sig Dispense Refill  . b complex vitamins tablet Take 1  tablet by mouth daily.    Marland Kitchen buPROPion (WELLBUTRIN XL) 150 MG 24 hr tablet Take 150 mg by mouth daily.    . calcium carbonate 200 MG capsule Take 250 mg by mouth 2 (two) times daily with a meal.    . citalopram (CELEXA) 20 MG tablet     . co-enzyme Q-10 30 MG capsule Take 30 mg by mouth 3 (three) times daily.    Marland Kitchen doxycycline (VIBRAMYCIN) 100 MG capsule Take 1 capsule (100 mg total) by mouth 2 (two) times daily. 20 capsule 0  . lamoTRIgine (LAMICTAL) 150 MG tablet Take 150 mg by mouth daily.    . Multiple Vitamins-Minerals (MULTIVITAMIN WITH MINERALS) tablet Take 1 tablet by mouth daily.     No current facility-administered medications on file prior to visit.   Allergies  Allergen Reactions  . Sulfa Antibiotics Hives     Review of Systems  Constitutional: Negative for fever and chills.  Respiratory: Negative for choking and shortness of breath.        Objective:   Physical Exam  Constitutional: She appears well-developed and well-nourished. No distress.  HENT:  Head: Normocephalic and atraumatic.  Eyes: Right eye exhibits no discharge. Left eye exhibits no discharge.  Cardiovascular: Normal rate and regular rhythm.   Pulmonary/Chest: Effort normal. No respiratory distress.  Neurological: She is alert. Coordination normal.  Skin: No  rash noted. She is not diaphoretic.  Psychiatric: She has a normal mood and affect. Her behavior is normal.  Nursing note and vitals reviewed.   Filed Vitals:   08/30/14 1026  BP: 112/82  Pulse: 67  Temp: 97.7 F (36.5 C)  TempSrc: Oral  Resp: 16  Height: 5' 5.7" (1.669 m)  Weight: 139 lb 4 oz (63.163 kg)  SpO2: 99%           Assessment & Plan:  Abscess - resolving on antibiotics - no need for I&D. Complete antibiotics and cont warm compresses. RTC if worsens or recurs.  I personally performed the services described in this documentation, which was scribed in my presence. The recorded information has been reviewed and considered, and  addended by me as needed.  Delman Cheadle, MD MPH

## 2014-09-16 ENCOUNTER — Other Ambulatory Visit: Payer: Self-pay | Admitting: Orthopedic Surgery

## 2014-09-16 DIAGNOSIS — M25561 Pain in right knee: Secondary | ICD-10-CM

## 2014-09-21 ENCOUNTER — Ambulatory Visit
Admission: RE | Admit: 2014-09-21 | Discharge: 2014-09-21 | Disposition: A | Discharge status: Home / Self Care | Payer: BLUE CROSS/BLUE SHIELD | Source: Ambulatory Visit | Attending: Orthopedic Surgery | Admitting: Orthopedic Surgery

## 2014-09-21 DIAGNOSIS — M25561 Pain in right knee: Secondary | ICD-10-CM

## 2015-06-01 ENCOUNTER — Other Ambulatory Visit: Payer: Self-pay | Admitting: Obstetrics & Gynecology

## 2015-06-01 LAB — HM PAP SMEAR

## 2015-06-02 LAB — CYTOLOGY - PAP

## 2015-08-18 DIAGNOSIS — H52203 Unspecified astigmatism, bilateral: Secondary | ICD-10-CM | POA: Diagnosis not present

## 2015-08-18 DIAGNOSIS — H5213 Myopia, bilateral: Secondary | ICD-10-CM | POA: Diagnosis not present

## 2015-08-18 DIAGNOSIS — H524 Presbyopia: Secondary | ICD-10-CM | POA: Diagnosis not present

## 2015-08-20 DIAGNOSIS — Z30431 Encounter for routine checking of intrauterine contraceptive device: Secondary | ICD-10-CM | POA: Diagnosis not present

## 2015-09-02 DIAGNOSIS — G43019 Migraine without aura, intractable, without status migrainosus: Secondary | ICD-10-CM | POA: Diagnosis not present

## 2015-11-22 DIAGNOSIS — F4323 Adjustment disorder with mixed anxiety and depressed mood: Secondary | ICD-10-CM | POA: Diagnosis not present

## 2015-11-29 DIAGNOSIS — N9089 Other specified noninflammatory disorders of vulva and perineum: Secondary | ICD-10-CM | POA: Diagnosis not present

## 2015-11-29 DIAGNOSIS — Z6820 Body mass index (BMI) 20.0-20.9, adult: Secondary | ICD-10-CM | POA: Diagnosis not present

## 2015-12-06 DIAGNOSIS — F4323 Adjustment disorder with mixed anxiety and depressed mood: Secondary | ICD-10-CM | POA: Diagnosis not present

## 2015-12-13 DIAGNOSIS — F4323 Adjustment disorder with mixed anxiety and depressed mood: Secondary | ICD-10-CM | POA: Diagnosis not present

## 2015-12-30 DIAGNOSIS — F4323 Adjustment disorder with mixed anxiety and depressed mood: Secondary | ICD-10-CM | POA: Diagnosis not present

## 2016-01-13 DIAGNOSIS — Z6820 Body mass index (BMI) 20.0-20.9, adult: Secondary | ICD-10-CM | POA: Diagnosis not present

## 2016-01-13 DIAGNOSIS — Z30432 Encounter for removal of intrauterine contraceptive device: Secondary | ICD-10-CM | POA: Diagnosis not present

## 2016-01-20 DIAGNOSIS — F4323 Adjustment disorder with mixed anxiety and depressed mood: Secondary | ICD-10-CM | POA: Diagnosis not present

## 2016-01-25 DIAGNOSIS — Z23 Encounter for immunization: Secondary | ICD-10-CM | POA: Diagnosis not present

## 2016-02-16 DIAGNOSIS — F4323 Adjustment disorder with mixed anxiety and depressed mood: Secondary | ICD-10-CM | POA: Diagnosis not present

## 2016-03-14 DIAGNOSIS — F4323 Adjustment disorder with mixed anxiety and depressed mood: Secondary | ICD-10-CM | POA: Diagnosis not present

## 2016-03-21 DIAGNOSIS — M7581 Other shoulder lesions, right shoulder: Secondary | ICD-10-CM | POA: Diagnosis not present

## 2016-03-21 DIAGNOSIS — G8929 Other chronic pain: Secondary | ICD-10-CM | POA: Diagnosis not present

## 2016-03-21 DIAGNOSIS — M25511 Pain in right shoulder: Secondary | ICD-10-CM | POA: Diagnosis not present

## 2016-03-21 DIAGNOSIS — M7541 Impingement syndrome of right shoulder: Secondary | ICD-10-CM | POA: Diagnosis not present

## 2016-03-21 DIAGNOSIS — Z9889 Other specified postprocedural states: Secondary | ICD-10-CM | POA: Diagnosis not present

## 2016-03-24 DIAGNOSIS — F4323 Adjustment disorder with mixed anxiety and depressed mood: Secondary | ICD-10-CM | POA: Diagnosis not present

## 2016-04-11 DIAGNOSIS — Z681 Body mass index (BMI) 19 or less, adult: Secondary | ICD-10-CM | POA: Diagnosis not present

## 2016-04-11 DIAGNOSIS — N920 Excessive and frequent menstruation with regular cycle: Secondary | ICD-10-CM | POA: Diagnosis not present

## 2016-04-11 DIAGNOSIS — R140 Abdominal distension (gaseous): Secondary | ICD-10-CM | POA: Diagnosis not present

## 2016-04-21 DIAGNOSIS — K30 Functional dyspepsia: Secondary | ICD-10-CM | POA: Diagnosis not present

## 2016-04-21 DIAGNOSIS — R140 Abdominal distension (gaseous): Secondary | ICD-10-CM | POA: Diagnosis not present

## 2016-04-21 DIAGNOSIS — R1084 Generalized abdominal pain: Secondary | ICD-10-CM | POA: Diagnosis not present

## 2016-05-25 DIAGNOSIS — K2930 Chronic superficial gastritis without bleeding: Secondary | ICD-10-CM | POA: Diagnosis not present

## 2016-05-25 DIAGNOSIS — R1084 Generalized abdominal pain: Secondary | ICD-10-CM | POA: Diagnosis not present

## 2016-05-25 DIAGNOSIS — K2900 Acute gastritis without bleeding: Secondary | ICD-10-CM | POA: Diagnosis not present

## 2016-05-25 DIAGNOSIS — K219 Gastro-esophageal reflux disease without esophagitis: Secondary | ICD-10-CM | POA: Diagnosis not present

## 2016-05-25 DIAGNOSIS — R12 Heartburn: Secondary | ICD-10-CM | POA: Diagnosis not present

## 2016-05-25 LAB — HM COLONOSCOPY

## 2016-06-01 DIAGNOSIS — K2900 Acute gastritis without bleeding: Secondary | ICD-10-CM | POA: Diagnosis not present

## 2016-07-25 DIAGNOSIS — N920 Excessive and frequent menstruation with regular cycle: Secondary | ICD-10-CM | POA: Diagnosis not present

## 2016-07-25 DIAGNOSIS — Z6820 Body mass index (BMI) 20.0-20.9, adult: Secondary | ICD-10-CM | POA: Diagnosis not present

## 2016-07-25 DIAGNOSIS — Z0000 Encounter for general adult medical examination without abnormal findings: Secondary | ICD-10-CM | POA: Diagnosis not present

## 2016-07-25 DIAGNOSIS — Z1231 Encounter for screening mammogram for malignant neoplasm of breast: Secondary | ICD-10-CM | POA: Diagnosis not present

## 2016-07-25 DIAGNOSIS — Z01419 Encounter for gynecological examination (general) (routine) without abnormal findings: Secondary | ICD-10-CM | POA: Diagnosis not present

## 2016-08-10 DIAGNOSIS — G43009 Migraine without aura, not intractable, without status migrainosus: Secondary | ICD-10-CM | POA: Diagnosis not present

## 2016-09-18 DIAGNOSIS — H43813 Vitreous degeneration, bilateral: Secondary | ICD-10-CM | POA: Diagnosis not present

## 2016-09-18 DIAGNOSIS — H5213 Myopia, bilateral: Secondary | ICD-10-CM | POA: Diagnosis not present

## 2016-09-18 DIAGNOSIS — H524 Presbyopia: Secondary | ICD-10-CM | POA: Diagnosis not present

## 2016-10-10 DIAGNOSIS — H9042 Sensorineural hearing loss, unilateral, left ear, with unrestricted hearing on the contralateral side: Secondary | ICD-10-CM | POA: Diagnosis not present

## 2016-10-10 DIAGNOSIS — H6123 Impacted cerumen, bilateral: Secondary | ICD-10-CM | POA: Diagnosis not present

## 2016-10-11 ENCOUNTER — Other Ambulatory Visit (INDEPENDENT_AMBULATORY_CARE_PROVIDER_SITE_OTHER): Payer: Self-pay | Admitting: Otolaryngology

## 2016-10-11 ENCOUNTER — Ambulatory Visit
Admission: RE | Admit: 2016-10-11 | Discharge: 2016-10-11 | Disposition: A | Discharge status: Home / Self Care | Payer: BLUE CROSS/BLUE SHIELD | Source: Ambulatory Visit | Attending: Otolaryngology | Admitting: Otolaryngology

## 2016-10-11 DIAGNOSIS — H918X9 Other specified hearing loss, unspecified ear: Secondary | ICD-10-CM

## 2016-10-11 DIAGNOSIS — G43909 Migraine, unspecified, not intractable, without status migrainosus: Secondary | ICD-10-CM | POA: Diagnosis not present

## 2016-10-11 MED ORDER — GADOBENATE DIMEGLUMINE 529 MG/ML IV SOLN
15.0000 mL | Freq: Once | INTRAVENOUS | Status: AC | PRN
  Administered 2016-10-11: 11 mL via INTRAVENOUS

## 2016-12-14 DIAGNOSIS — Z85828 Personal history of other malignant neoplasm of skin: Secondary | ICD-10-CM | POA: Diagnosis not present

## 2016-12-14 DIAGNOSIS — L239 Allergic contact dermatitis, unspecified cause: Secondary | ICD-10-CM | POA: Diagnosis not present

## 2017-01-29 DIAGNOSIS — Z23 Encounter for immunization: Secondary | ICD-10-CM | POA: Diagnosis not present

## 2017-02-20 DIAGNOSIS — G8929 Other chronic pain: Secondary | ICD-10-CM | POA: Diagnosis not present

## 2017-02-20 DIAGNOSIS — M7541 Impingement syndrome of right shoulder: Secondary | ICD-10-CM | POA: Diagnosis not present

## 2017-02-20 DIAGNOSIS — M25811 Other specified joint disorders, right shoulder: Secondary | ICD-10-CM | POA: Diagnosis not present

## 2017-02-21 ENCOUNTER — Other Ambulatory Visit: Payer: Self-pay | Admitting: Orthopedic Surgery

## 2017-02-21 DIAGNOSIS — M25511 Pain in right shoulder: Secondary | ICD-10-CM

## 2017-02-28 ENCOUNTER — Ambulatory Visit
Admission: RE | Admit: 2017-02-28 | Discharge: 2017-02-28 | Disposition: A | Discharge status: Home / Self Care | Payer: BLUE CROSS/BLUE SHIELD | Source: Ambulatory Visit | Attending: Orthopedic Surgery | Admitting: Orthopedic Surgery

## 2017-02-28 DIAGNOSIS — M25511 Pain in right shoulder: Secondary | ICD-10-CM | POA: Diagnosis not present

## 2017-03-21 DIAGNOSIS — M24111 Other articular cartilage disorders, right shoulder: Secondary | ICD-10-CM | POA: Diagnosis not present

## 2017-03-21 DIAGNOSIS — M75111 Incomplete rotator cuff tear or rupture of right shoulder, not specified as traumatic: Secondary | ICD-10-CM | POA: Diagnosis not present

## 2017-03-21 DIAGNOSIS — G8918 Other acute postprocedural pain: Secondary | ICD-10-CM | POA: Diagnosis not present

## 2017-03-21 DIAGNOSIS — M19011 Primary osteoarthritis, right shoulder: Secondary | ICD-10-CM | POA: Diagnosis not present

## 2017-03-21 DIAGNOSIS — M7541 Impingement syndrome of right shoulder: Secondary | ICD-10-CM | POA: Diagnosis not present

## 2017-03-28 DIAGNOSIS — M7542 Impingement syndrome of left shoulder: Secondary | ICD-10-CM | POA: Diagnosis not present

## 2017-03-28 DIAGNOSIS — M25611 Stiffness of right shoulder, not elsewhere classified: Secondary | ICD-10-CM | POA: Diagnosis not present

## 2017-03-28 DIAGNOSIS — M25511 Pain in right shoulder: Secondary | ICD-10-CM | POA: Diagnosis not present

## 2017-03-28 DIAGNOSIS — Z9889 Other specified postprocedural states: Secondary | ICD-10-CM | POA: Diagnosis not present

## 2017-04-03 DIAGNOSIS — M25511 Pain in right shoulder: Secondary | ICD-10-CM | POA: Diagnosis not present

## 2017-04-03 DIAGNOSIS — Z9889 Other specified postprocedural states: Secondary | ICD-10-CM | POA: Diagnosis not present

## 2017-04-03 DIAGNOSIS — M7541 Impingement syndrome of right shoulder: Secondary | ICD-10-CM | POA: Diagnosis not present

## 2017-04-03 DIAGNOSIS — M25611 Stiffness of right shoulder, not elsewhere classified: Secondary | ICD-10-CM | POA: Diagnosis not present

## 2017-04-04 DIAGNOSIS — H9042 Sensorineural hearing loss, unilateral, left ear, with unrestricted hearing on the contralateral side: Secondary | ICD-10-CM | POA: Diagnosis not present

## 2017-04-23 DIAGNOSIS — M25611 Stiffness of right shoulder, not elsewhere classified: Secondary | ICD-10-CM | POA: Diagnosis not present

## 2017-04-23 DIAGNOSIS — M7541 Impingement syndrome of right shoulder: Secondary | ICD-10-CM | POA: Diagnosis not present

## 2017-04-23 DIAGNOSIS — M25511 Pain in right shoulder: Secondary | ICD-10-CM | POA: Diagnosis not present

## 2017-04-23 DIAGNOSIS — Z9889 Other specified postprocedural states: Secondary | ICD-10-CM | POA: Diagnosis not present

## 2017-05-21 ENCOUNTER — Encounter: Payer: Self-pay | Admitting: Family Medicine

## 2017-05-21 ENCOUNTER — Other Ambulatory Visit: Payer: Self-pay

## 2017-05-21 ENCOUNTER — Ambulatory Visit: Payer: BLUE CROSS/BLUE SHIELD | Admitting: Family Medicine

## 2017-05-21 VITALS — BP 98/66 | HR 82 | Temp 98.6°F | Resp 16 | Ht 65.75 in | Wt 118.4 lb

## 2017-05-21 DIAGNOSIS — J019 Acute sinusitis, unspecified: Secondary | ICD-10-CM | POA: Diagnosis not present

## 2017-05-21 MED ORDER — AMOXICILLIN-POT CLAVULANATE 875-125 MG PO TABS: 1.0000 | ORAL_TABLET | Freq: Two times a day (BID) | ORAL | 0 refills | Status: DC

## 2017-05-21 NOTE — Patient Instructions (Addendum)
     IF you received an x-ray today, you will receive an invoice from [facility]. Please contact [facility] at [phone] with questions or concerns regarding your invoice.   IF you received labwork today, you will receive an invoice from [facility]. Please contact [facility] at [phone] with questions or concerns regarding your invoice.   Our billing staff will not be able to assist you with questions regarding bills from these companies.  You will be contacted with the lab results as soon as they are available. The fastest way to get your results is to activate your My Chart account. Instructions are located on the last page of this paperwork. If you have not heard from us regarding the results in 2 weeks, please contact this office.     Sinusitis, Adult Sinusitis is soreness and inflammation of your sinuses. Sinuses are hollow spaces in the bones around your face. They are located:  Around your eyes.  In the middle of your forehead.  Behind your nose.  In your cheekbones.  Your sinuses and nasal passages are lined with a stringy fluid (mucus). Mucus normally drains out of your sinuses. When your nasal tissues get inflamed or swollen, the mucus can get trapped or blocked so air cannot flow through your sinuses. This lets bacteria, viruses, and funguses grow, and that leads to infection. Follow these instructions at home: Medicines  Take, use, or apply over-the-counter and prescription medicines only as told by your doctor. These may include nasal sprays.  If you were prescribed an antibiotic medicine, take it as told by your doctor. Do not stop taking the antibiotic even if you start to feel better. Hydrate and Humidify  Drink enough water to keep your pee (urine) clear or pale yellow.  Use a cool mist humidifier to keep the humidity level in your home above 50%.  Breathe in steam for 10-15 minutes, 3-4 times a day or as told by your doctor. You can do  this in the bathroom while a hot shower is running.  Try not to spend time in cool or dry air. Rest  Rest as much as possible.  Sleep with your head raised (elevated).  Make sure to get enough sleep each night. General instructions  Put a warm, moist washcloth on your face 3-4 times a day or as told by your doctor. This will help with discomfort.  Wash your hands often with soap and water. If there is no soap and water, use hand sanitizer.  Do not smoke. Avoid being around people who are smoking (secondhand smoke).  Keep all follow-up visits as told by your doctor. This is important. Contact a doctor if:  You have a fever.  Your symptoms get worse.  Your symptoms do not get better within 10 days. Get help right away if:  You have a very bad headache.  You cannot stop throwing up (vomiting).  You have pain or swelling around your face or eyes.  You have trouble seeing.  You feel confused.  Your neck is stiff.  You have trouble breathing. This information is not intended to replace advice given to you by your health care provider. Make sure you discuss any questions you have with your health care provider. Document Released: 09/20/2007 Document Revised: 11/28/2015 Document Reviewed: 01/27/2015 Elsevier Interactive Patient Education  2018 Elsevier Inc.  

## 2017-05-21 NOTE — Progress Notes (Signed)
2/4/201911:45 AM  Sara Davidson Feb 17, 1966, 52 y.o. female [id number]  Chief Complaint  Patient presents with  . nasal congestion    symptoms started 05/11/17  . chest congestion  . Headache    HPI:   Patient is a 52 y.o. female who presents today for 11 days of nasal congestion, headaches, sinus pressure/pain, cough, chest congestion. Has been trying to manage with steam, vit c, humidifier, warm liquids. Was getting worse so yesterday started with oral decongestant and mucinex, today feeling better, headache much improved, no sinus pain. No fever, chills, SOB, nausea, vomiting, diarrhea.  Depression screen [facility] 2/9 05/21/2017 08/30/2014  Decreased Interest 0 0  Down, Depressed, Hopeless 0 0  PHQ - 2 Score 0 0    Allergies  Allergen Reactions  . Sulfa Antibiotics Hives    Prior to Admission medications   Medication Sig Start Date End Date Taking? Authorizing Provider  Cholecalciferol (D3-1000 PO) Take by mouth.   Yes [provider]  Cyanocobalamin (B-12) 3000 MCG SUBL Place under the tongue.   Yes [provider]  Magnesium 300 MG CAPS Take by mouth.   Yes [provider]  Riboflavin (B2 PO) Take by mouth.   Yes [provider]  rizatriptan (MAXALT) 10 MG tablet Take 10 mg by mouth as needed for migraine. May repeat in 2 hours if needed   Yes [provider]    Past Medical History:  Diagnosis Date  . Anxiety   . Contact lens/glasses fitting    wears contacts or glasses  . Depression     Past Surgical History:  Procedure Laterality Date  . BREAST BIOPSY Right 07/04/2012   Procedure: Right Breast Wire Localized Excision;  Surgeon: Rolm Bookbinder, MD;  Location: [facility];  Service: General;  Laterality: Right;  . CERVICAL CONIZATION W/BX  2004  . HERNIA REPAIR  2011   UMB  . KNEE ARTHROSCOPY W/ ACL RECONSTRUCTION  9528,4132   Left  . LESION EXCISION Right    foot  . LIPOMA EXCISION  2008   lt  forearm  . METATARSAL OSTEOTOMY      Social History   Tobacco Use  . Smoking status: Never Smoker  . Smokeless tobacco: Never Used  Substance Use Topics  . Alcohol use: Yes    Alcohol/week: 0.0 oz    Comment: 2/day.    Family History  Problem Relation Age of Onset  . Cancer Mother        Breast  . Bipolar disorder Father     ROS Per hpi  OBJECTIVE:  Blood pressure 98/66, pulse 82, temperature 98.6 F (37 C), temperature source Oral, resp. rate 16, height 5' 5.75" (1.67 m), weight 118 lb 6.4 oz (53.7 kg), last menstrual period 05/19/2017, SpO2 98 %.  Physical Exam  Constitutional: She is oriented to person, place, and time and well-developed, well-nourished, and in no distress.  HENT:  Head: Normocephalic and atraumatic.  Right Ear: Hearing, tympanic membrane, external ear and ear canal normal.  Left Ear: Hearing, tympanic membrane, external ear and ear canal normal.  Nose: Right sinus exhibits no maxillary sinus tenderness and no frontal sinus tenderness. Left sinus exhibits no maxillary sinus tenderness and no frontal sinus tenderness.  Mouth/Throat: Oropharynx is clear and moist.  Nares are erythematous and dry, with small areas of dried blood  Eyes: EOM are normal. Pupils are equal, round, and reactive to light.  Neck: Neck supple.  Cardiovascular: Normal rate, regular rhythm and normal heart  sounds. Exam reveals no gallop and no friction rub.  No murmur heard. Pulmonary/Chest: Effort normal and breath sounds normal. She has no wheezes. She has no rales.  Lymphadenopathy:    She has no cervical adenopathy.  Neurological: She is alert and oriented to person, place, and time. Gait normal.  Skin: Skin is warm and dry.     ASSESSMENT and PLAN  1. Acute non-recurrent sinusitis, unspecified location Discussed continuing with current OTC meds, add nasal saline washes. Discussed delayed abx prescription and ssx for which to start. Patient educational handout given. RTC  precautions reviewed.  - amoxicillin-clavulanate (AUGMENTIN) 875-125 MG tablet; Take 1 tablet by mouth 2 (two) times daily.  Return if symptoms worsen or fail to improve.    Rutherford Guys, MD Primary [facility] at [place], [place] [address] Ph.  [phone] Fax [phone]

## 2017-05-22 DIAGNOSIS — M7541 Impingement syndrome of right shoulder: Secondary | ICD-10-CM | POA: Diagnosis not present

## 2017-05-22 DIAGNOSIS — M25611 Stiffness of right shoulder, not elsewhere classified: Secondary | ICD-10-CM | POA: Diagnosis not present

## 2017-05-22 DIAGNOSIS — Z9889 Other specified postprocedural states: Secondary | ICD-10-CM | POA: Diagnosis not present

## 2017-05-22 DIAGNOSIS — M25511 Pain in right shoulder: Secondary | ICD-10-CM | POA: Diagnosis not present

## 2017-05-30 DIAGNOSIS — M25511 Pain in right shoulder: Secondary | ICD-10-CM | POA: Diagnosis not present

## 2017-05-30 DIAGNOSIS — M7541 Impingement syndrome of right shoulder: Secondary | ICD-10-CM | POA: Diagnosis not present

## 2017-05-30 DIAGNOSIS — M25611 Stiffness of right shoulder, not elsewhere classified: Secondary | ICD-10-CM | POA: Diagnosis not present

## 2017-05-30 DIAGNOSIS — Z9889 Other specified postprocedural states: Secondary | ICD-10-CM | POA: Diagnosis not present

## 2017-06-05 DIAGNOSIS — M7541 Impingement syndrome of right shoulder: Secondary | ICD-10-CM | POA: Diagnosis not present

## 2017-06-05 DIAGNOSIS — M25611 Stiffness of right shoulder, not elsewhere classified: Secondary | ICD-10-CM | POA: Diagnosis not present

## 2017-06-05 DIAGNOSIS — M25511 Pain in right shoulder: Secondary | ICD-10-CM | POA: Diagnosis not present

## 2017-06-05 DIAGNOSIS — Z9889 Other specified postprocedural states: Secondary | ICD-10-CM | POA: Diagnosis not present

## 2017-06-08 DIAGNOSIS — M25511 Pain in right shoulder: Secondary | ICD-10-CM | POA: Diagnosis not present

## 2017-06-08 DIAGNOSIS — Z4889 Encounter for other specified surgical aftercare: Secondary | ICD-10-CM | POA: Diagnosis not present

## 2017-06-19 DIAGNOSIS — M25511 Pain in right shoulder: Secondary | ICD-10-CM | POA: Diagnosis not present

## 2017-06-19 DIAGNOSIS — M7541 Impingement syndrome of right shoulder: Secondary | ICD-10-CM | POA: Diagnosis not present

## 2017-06-19 DIAGNOSIS — Z9889 Other specified postprocedural states: Secondary | ICD-10-CM | POA: Diagnosis not present

## 2017-06-19 DIAGNOSIS — M25611 Stiffness of right shoulder, not elsewhere classified: Secondary | ICD-10-CM | POA: Diagnosis not present

## 2017-07-12 DIAGNOSIS — Z0000 Encounter for general adult medical examination without abnormal findings: Secondary | ICD-10-CM | POA: Diagnosis not present

## 2017-07-17 DIAGNOSIS — G43009 Migraine without aura, not intractable, without status migrainosus: Secondary | ICD-10-CM | POA: Diagnosis not present

## 2017-07-24 DIAGNOSIS — Z681 Body mass index (BMI) 19 or less, adult: Secondary | ICD-10-CM | POA: Diagnosis not present

## 2017-07-24 DIAGNOSIS — N760 Acute vaginitis: Secondary | ICD-10-CM | POA: Diagnosis not present

## 2017-08-01 DIAGNOSIS — Z1231 Encounter for screening mammogram for malignant neoplasm of breast: Secondary | ICD-10-CM | POA: Diagnosis not present

## 2017-08-01 DIAGNOSIS — N920 Excessive and frequent menstruation with regular cycle: Secondary | ICD-10-CM | POA: Diagnosis not present

## 2017-08-01 DIAGNOSIS — Z681 Body mass index (BMI) 19 or less, adult: Secondary | ICD-10-CM | POA: Diagnosis not present

## 2017-08-01 DIAGNOSIS — Z01419 Encounter for gynecological examination (general) (routine) without abnormal findings: Secondary | ICD-10-CM | POA: Diagnosis not present

## 2017-08-02 DIAGNOSIS — R1030 Lower abdominal pain, unspecified: Secondary | ICD-10-CM | POA: Diagnosis not present

## 2017-08-03 ENCOUNTER — Other Ambulatory Visit: Payer: Self-pay | Admitting: Student

## 2017-08-03 ENCOUNTER — Ambulatory Visit
Admission: RE | Admit: 2017-08-03 | Discharge: 2017-08-03 | Disposition: A | Discharge status: Home / Self Care | Payer: BLUE CROSS/BLUE SHIELD | Source: Ambulatory Visit | Attending: Student | Admitting: Student

## 2017-08-03 DIAGNOSIS — K429 Umbilical hernia without obstruction or gangrene: Secondary | ICD-10-CM | POA: Diagnosis not present

## 2017-08-03 DIAGNOSIS — Z09 Encounter for follow-up examination after completed treatment for conditions other than malignant neoplasm: Secondary | ICD-10-CM

## 2017-08-03 MED ORDER — IOPAMIDOL (ISOVUE-300) INJECTION 61%
100.0000 mL | Freq: Once | INTRAVENOUS | Status: AC | PRN
  Administered 2017-08-03: 100 mL via INTRAVENOUS

## 2017-08-08 ENCOUNTER — Other Ambulatory Visit: Payer: BLUE CROSS/BLUE SHIELD

## 2017-08-09 ENCOUNTER — Other Ambulatory Visit: Payer: Self-pay | Admitting: Obstetrics & Gynecology

## 2017-08-09 DIAGNOSIS — K769 Liver disease, unspecified: Secondary | ICD-10-CM

## 2017-08-12 ENCOUNTER — Ambulatory Visit
Admission: RE | Admit: 2017-08-12 | Discharge: 2017-08-12 | Disposition: A | Discharge status: Home / Self Care | Payer: BLUE CROSS/BLUE SHIELD | Source: Ambulatory Visit | Attending: Obstetrics & Gynecology | Admitting: Obstetrics & Gynecology

## 2017-08-12 DIAGNOSIS — K769 Liver disease, unspecified: Secondary | ICD-10-CM

## 2017-08-12 DIAGNOSIS — D134 Benign neoplasm of liver: Secondary | ICD-10-CM | POA: Diagnosis not present

## 2017-08-12 DIAGNOSIS — K7689 Other specified diseases of liver: Secondary | ICD-10-CM | POA: Diagnosis not present

## 2017-08-12 MED ORDER — GADOBENATE DIMEGLUMINE 529 MG/ML IV SOLN
10.0000 mL | Freq: Once | INTRAVENOUS | Status: AC | PRN
  Administered 2017-08-12: 10 mL via INTRAVENOUS

## 2017-08-22 DIAGNOSIS — N840 Polyp of corpus uteri: Secondary | ICD-10-CM | POA: Diagnosis not present

## 2017-08-22 DIAGNOSIS — Z681 Body mass index (BMI) 19 or less, adult: Secondary | ICD-10-CM | POA: Diagnosis not present

## 2017-08-22 DIAGNOSIS — Z3202 Encounter for pregnancy test, result negative: Secondary | ICD-10-CM | POA: Diagnosis not present

## 2017-08-22 DIAGNOSIS — N939 Abnormal uterine and vaginal bleeding, unspecified: Secondary | ICD-10-CM | POA: Diagnosis not present

## 2017-09-18 NOTE — Patient Instructions (Addendum)
Your procedure is scheduled on:  Wednesday, June 12  Enter through the [employer] [facility] at: 9 am  Pick up the phone at the desk and dial [phone].  Call this number if you have problems the morning of surgery: [phone].  Remember: Do NOT eat food or Do NOT drink clear liquids (including water) after midnight Tuesday.  Take these medicines the morning of surgery with a SIP OF WATER: None  Bring inhaler with you on day of surgery.  Do Not smoke on the day of surgery.  Stop herbal medications and supplements at this time.  Do NOT wear jewelry (body piercing), metal hair clips/bobby pins, make-up, or nail polish. Do NOT wear lotions, powders, or perfumes.  You may wear deoderant. Do NOT shave for 48 hours prior to surgery. Do NOT bring valuables to the hospital. Contacts, dentures, or bridgework may not be worn into surgery.  Leave suitcase in car.  After surgery it may be brought to your room.    For patients admitted to the hospital, checkout time is 11:00 AM the day of discharge.

## 2017-09-19 ENCOUNTER — Other Ambulatory Visit (HOSPITAL_COMMUNITY): Payer: BLUE CROSS/BLUE SHIELD

## 2017-09-21 ENCOUNTER — Encounter (HOSPITAL_COMMUNITY)
Admission: RE | Admit: 2017-09-21 | Discharge: 2017-09-21 | Disposition: A | Discharge status: Home / Self Care | Payer: BLUE CROSS/BLUE SHIELD | Source: Ambulatory Visit | Attending: Obstetrics | Admitting: Obstetrics

## 2017-09-21 ENCOUNTER — Encounter (HOSPITAL_COMMUNITY): Payer: Self-pay

## 2017-09-21 ENCOUNTER — Other Ambulatory Visit: Payer: Self-pay

## 2017-09-21 DIAGNOSIS — Z01812 Encounter for preprocedural laboratory examination: Secondary | ICD-10-CM | POA: Diagnosis not present

## 2017-09-21 HISTORY — DX: Anemia, unspecified: D64.9

## 2017-09-21 HISTORY — DX: Headache, unspecified: R51.9

## 2017-09-21 HISTORY — DX: Headache: R51

## 2017-09-21 LAB — CBC
HEMATOCRIT: 39.8 % (ref 36.0–46.0)
HEMOGLOBIN: 13 g/dL (ref 12.0–15.0)
MCH: 29.4 pg (ref 26.0–34.0)
MCHC: 32.7 g/dL (ref 30.0–36.0)
MCV: 90 fL (ref 78.0–100.0)
Platelets: 238 10*3/uL (ref 150–400)
RBC: 4.42 MIL/uL (ref 3.87–5.11)
RDW: 14.9 % (ref 11.5–15.5)
WBC: 4.3 10*3/uL (ref 4.0–10.5)

## 2017-09-21 LAB — BASIC METABOLIC PANEL
ANION GAP: 10 (ref 5–15)
BUN: 9 mg/dL (ref 6–20)
CHLORIDE: 107 mmol/L (ref 101–111)
CO2: 23 mmol/L (ref 22–32)
Calcium: 9.7 mg/dL (ref 8.9–10.3)
Creatinine, Ser: 0.71 mg/dL (ref 0.44–1.00)
Glucose, Bld: 94 mg/dL (ref 65–99)
POTASSIUM: 4.4 mmol/L (ref 3.5–5.1)
SODIUM: 140 mmol/L (ref 135–145)

## 2017-09-21 LAB — TYPE AND SCREEN
ABO/RH(D): A POS
ANTIBODY SCREEN: NEGATIVE

## 2017-09-21 LAB — ABO/RH: ABO/RH(D): A POS

## 2017-09-26 ENCOUNTER — Encounter (HOSPITAL_COMMUNITY): Payer: Self-pay | Admitting: Obstetrics

## 2017-09-26 ENCOUNTER — Encounter (HOSPITAL_COMMUNITY)
Admission: RE | Disposition: A | Discharge status: Home / Self Care | Payer: Self-pay | Source: Ambulatory Visit | Attending: Obstetrics

## 2017-09-26 ENCOUNTER — Ambulatory Visit (HOSPITAL_COMMUNITY): Payer: BLUE CROSS/BLUE SHIELD | Admitting: Anesthesiology

## 2017-09-26 ENCOUNTER — Other Ambulatory Visit: Payer: Self-pay

## 2017-09-26 ENCOUNTER — Observation Stay (HOSPITAL_COMMUNITY)
Admission: RE | Admit: 2017-09-26 | Discharge: 2017-09-27 | Disposition: A | Discharge status: Home / Self Care | Payer: BLUE CROSS/BLUE SHIELD | Source: Ambulatory Visit | Attending: Obstetrics | Admitting: Obstetrics

## 2017-09-26 DIAGNOSIS — N800 Endometriosis of uterus: Secondary | ICD-10-CM | POA: Insufficient documentation

## 2017-09-26 DIAGNOSIS — N920 Excessive and frequent menstruation with regular cycle: Secondary | ICD-10-CM | POA: Diagnosis not present

## 2017-09-26 DIAGNOSIS — Z79899 Other long term (current) drug therapy: Secondary | ICD-10-CM | POA: Diagnosis not present

## 2017-09-26 DIAGNOSIS — D259 Leiomyoma of uterus, unspecified: Principal | ICD-10-CM | POA: Insufficient documentation

## 2017-09-26 DIAGNOSIS — Z882 Allergy status to sulfonamides status: Secondary | ICD-10-CM | POA: Insufficient documentation

## 2017-09-26 DIAGNOSIS — N888 Other specified noninflammatory disorders of cervix uteri: Secondary | ICD-10-CM | POA: Diagnosis not present

## 2017-09-26 DIAGNOSIS — Z90710 Acquired absence of both cervix and uterus: Secondary | ICD-10-CM

## 2017-09-26 HISTORY — PX: LAPAROSCOPIC VAGINAL HYSTERECTOMY WITH SALPINGECTOMY: SHX6680

## 2017-09-26 LAB — PREGNANCY, URINE: Preg Test, Ur: NEGATIVE

## 2017-09-26 SURGERY — HYSTERECTOMY, VAGINAL, LAPAROSCOPY-ASSISTED, WITH SALPINGECTOMY
Anesthesia: General | Site: Abdomen | Laterality: Bilateral

## 2017-09-26 MED ORDER — BUPIVACAINE HCL (PF) 0.25 % IJ SOLN
INTRAMUSCULAR | Status: AC
  Filled 2017-09-26: qty 30

## 2017-09-26 MED ORDER — MEPERIDINE HCL 25 MG/ML IJ SOLN
6.2500 mg | INTRAMUSCULAR | Status: DC | PRN
  Administered 2017-09-26 (×2): 6.25 mg via INTRAVENOUS

## 2017-09-26 MED ORDER — ROCURONIUM BROMIDE 100 MG/10ML IV SOLN
INTRAVENOUS | Status: DC | PRN
  Administered 2017-09-26: 10 mg via INTRAVENOUS
  Administered 2017-09-26: 50 mg via INTRAVENOUS

## 2017-09-26 MED ORDER — MIDAZOLAM HCL 2 MG/2ML IJ SOLN
INTRAMUSCULAR | Status: DC | PRN
  Administered 2017-09-26: 2 mg via INTRAVENOUS

## 2017-09-26 MED ORDER — LIDOCAINE HCL (CARDIAC) PF 100 MG/5ML IV SOSY
PREFILLED_SYRINGE | INTRAVENOUS | Status: DC | PRN
  Administered 2017-09-26: 100 mg via INTRAVENOUS

## 2017-09-26 MED ORDER — ONDANSETRON HCL 4 MG/2ML IJ SOLN: 4.0000 mg | Freq: Four times a day (QID) | INTRAMUSCULAR | Status: DC | PRN

## 2017-09-26 MED ORDER — SENNOSIDES-DOCUSATE SODIUM 8.6-50 MG PO TABS: 1.0000 | ORAL_TABLET | Freq: Every evening | ORAL | Status: DC | PRN

## 2017-09-26 MED ORDER — ACETAMINOPHEN 325 MG PO TABS
650.0000 mg | ORAL_TABLET | Freq: Four times a day (QID) | ORAL | Status: DC
  Administered 2017-09-26 – 2017-09-27 (×2): 650 mg via ORAL
  Filled 2017-09-26 (×2): qty 2

## 2017-09-26 MED ORDER — CEFAZOLIN SODIUM-DEXTROSE 2-4 GM/100ML-% IV SOLN
INTRAVENOUS | Status: AC
  Filled 2017-09-26: qty 100

## 2017-09-26 MED ORDER — VASOPRESSIN 20 UNIT/ML IV SOLN
INTRAVENOUS | Status: AC
  Filled 2017-09-26: qty 1

## 2017-09-26 MED ORDER — ACETAMINOPHEN 325 MG PO TABS: 325.0000 mg | ORAL_TABLET | ORAL | Status: DC | PRN

## 2017-09-26 MED ORDER — FENTANYL CITRATE (PF) 100 MCG/2ML IJ SOLN
INTRAMUSCULAR | Status: AC
  Filled 2017-09-26: qty 2

## 2017-09-26 MED ORDER — KETOROLAC TROMETHAMINE 30 MG/ML IJ SOLN
INTRAMUSCULAR | Status: DC | PRN
  Administered 2017-09-26: 30 mg via INTRAVENOUS

## 2017-09-26 MED ORDER — MEPERIDINE HCL 25 MG/ML IJ SOLN
INTRAMUSCULAR | Status: AC
  Filled 2017-09-26: qty 1

## 2017-09-26 MED ORDER — SUGAMMADEX SODIUM 200 MG/2ML IV SOLN
INTRAVENOUS | Status: DC | PRN
  Administered 2017-09-26: 200 mg via INTRAVENOUS

## 2017-09-26 MED ORDER — SCOPOLAMINE 1 MG/3DAYS TD PT72
1.0000 | MEDICATED_PATCH | Freq: Once | TRANSDERMAL | Status: DC
  Administered 2017-09-26: 1.5 mg via TRANSDERMAL

## 2017-09-26 MED ORDER — SODIUM CHLORIDE 0.9 % IJ SOLN
INTRAMUSCULAR | Status: AC
Start: 2017-09-26 — End: ?
  Filled 2017-09-26: qty 100

## 2017-09-26 MED ORDER — OXYCODONE HCL 5 MG PO TABS
5.0000 mg | ORAL_TABLET | Freq: Four times a day (QID) | ORAL | Status: DC | PRN
Start: 2017-09-26 — End: 2017-09-27
  Administered 2017-09-26: 5 mg via ORAL
  Filled 2017-09-26: qty 1

## 2017-09-26 MED ORDER — SIMETHICONE 80 MG PO CHEW: 80.0000 mg | CHEWABLE_TABLET | Freq: Four times a day (QID) | ORAL | Status: DC | PRN

## 2017-09-26 MED ORDER — SCOPOLAMINE 1 MG/3DAYS TD PT72
MEDICATED_PATCH | TRANSDERMAL | Status: AC
  Filled 2017-09-26: qty 1

## 2017-09-26 MED ORDER — FENTANYL CITRATE (PF) 250 MCG/5ML IJ SOLN
INTRAMUSCULAR | Status: AC
  Filled 2017-09-26: qty 5

## 2017-09-26 MED ORDER — SUGAMMADEX SODIUM 200 MG/2ML IV SOLN
INTRAVENOUS | Status: AC
  Filled 2017-09-26: qty 2

## 2017-09-26 MED ORDER — DOCUSATE SODIUM 100 MG PO CAPS
100.0000 mg | ORAL_CAPSULE | Freq: Two times a day (BID) | ORAL | Status: DC
  Administered 2017-09-26: 100 mg via ORAL
  Filled 2017-09-26: qty 1

## 2017-09-26 MED ORDER — PROPOFOL 10 MG/ML IV BOLUS
INTRAVENOUS | Status: AC
  Filled 2017-09-26: qty 20

## 2017-09-26 MED ORDER — POLYETHYLENE GLYCOL 3350 17 G PO PACK: 17.0000 g | PACK | Freq: Every day | ORAL | Status: DC

## 2017-09-26 MED ORDER — ACETAMINOPHEN 160 MG/5ML PO SOLN: 325.0000 mg | ORAL | Status: DC | PRN

## 2017-09-26 MED ORDER — HYDROMORPHONE HCL 1 MG/ML IJ SOLN
INTRAMUSCULAR | Status: AC
  Filled 2017-09-26: qty 1

## 2017-09-26 MED ORDER — ONDANSETRON HCL 4 MG/2ML IJ SOLN
INTRAMUSCULAR | Status: AC
  Filled 2017-09-26: qty 2

## 2017-09-26 MED ORDER — FENTANYL CITRATE (PF) 100 MCG/2ML IJ SOLN
INTRAMUSCULAR | Status: DC | PRN
  Administered 2017-09-26 (×2): 100 ug via INTRAVENOUS
  Administered 2017-09-26: 50 ug via INTRAVENOUS

## 2017-09-26 MED ORDER — OXYCODONE HCL 5 MG PO TABS: 5.0000 mg | ORAL_TABLET | Freq: Once | ORAL | Status: DC | PRN

## 2017-09-26 MED ORDER — SODIUM CHLORIDE 0.9 % IR SOLN
Status: DC | PRN
  Administered 2017-09-26: 3000 mL

## 2017-09-26 MED ORDER — PROPOFOL 10 MG/ML IV BOLUS
INTRAVENOUS | Status: DC | PRN
  Administered 2017-09-26: 150 mg via INTRAVENOUS

## 2017-09-26 MED ORDER — DEXAMETHASONE SODIUM PHOSPHATE 4 MG/ML IJ SOLN
INTRAMUSCULAR | Status: AC
  Filled 2017-09-26: qty 1

## 2017-09-26 MED ORDER — ONDANSETRON HCL 4 MG PO TABS: 4.0000 mg | ORAL_TABLET | Freq: Four times a day (QID) | ORAL | Status: DC | PRN

## 2017-09-26 MED ORDER — DEXAMETHASONE SODIUM PHOSPHATE 10 MG/ML IJ SOLN
INTRAMUSCULAR | Status: DC | PRN
  Administered 2017-09-26: 4 mg via INTRAVENOUS

## 2017-09-26 MED ORDER — CEFAZOLIN SODIUM-DEXTROSE 2-4 GM/100ML-% IV SOLN
2.0000 g | INTRAVENOUS | Status: AC
  Administered 2017-09-26: 2 g via INTRAVENOUS

## 2017-09-26 MED ORDER — LACTATED RINGERS IV SOLN
INTRAVENOUS | Status: DC
  Administered 2017-09-26: 125 mL/h via INTRAVENOUS
  Administered 2017-09-26 (×2): via INTRAVENOUS

## 2017-09-26 MED ORDER — IBUPROFEN 600 MG PO TABS
600.0000 mg | ORAL_TABLET | Freq: Four times a day (QID) | ORAL | Status: DC
  Administered 2017-09-26 – 2017-09-27 (×3): 600 mg via ORAL
  Filled 2017-09-26 (×4): qty 1

## 2017-09-26 MED ORDER — FENTANYL CITRATE (PF) 100 MCG/2ML IJ SOLN
25.0000 ug | INTRAMUSCULAR | Status: DC | PRN
  Administered 2017-09-26: 50 ug via INTRAVENOUS

## 2017-09-26 MED ORDER — OXYCODONE HCL 5 MG/5ML PO SOLN: 5.0000 mg | Freq: Once | ORAL | Status: DC | PRN

## 2017-09-26 MED ORDER — ROCURONIUM BROMIDE 100 MG/10ML IV SOLN
INTRAVENOUS | Status: AC
  Filled 2017-09-26: qty 1

## 2017-09-26 MED ORDER — LACTATED RINGERS IV SOLN: INTRAVENOUS | Status: DC

## 2017-09-26 MED ORDER — ONDANSETRON HCL 4 MG/2ML IJ SOLN: 4.0000 mg | Freq: Once | INTRAMUSCULAR | Status: DC | PRN

## 2017-09-26 MED ORDER — HYDROMORPHONE HCL 1 MG/ML IJ SOLN
INTRAMUSCULAR | Status: DC | PRN
  Administered 2017-09-26: 1 mg via INTRAVENOUS

## 2017-09-26 MED ORDER — VASOPRESSIN 20 UNIT/ML IV SOLN
INTRAVENOUS | Status: DC | PRN
  Administered 2017-09-26: 12 mL via INTRAMUSCULAR

## 2017-09-26 MED ORDER — LIDOCAINE HCL (CARDIAC) PF 100 MG/5ML IV SOSY
PREFILLED_SYRINGE | INTRAVENOUS | Status: AC
  Filled 2017-09-26: qty 5

## 2017-09-26 MED ORDER — MIDAZOLAM HCL 2 MG/2ML IJ SOLN
INTRAMUSCULAR | Status: AC
  Filled 2017-09-26: qty 2

## 2017-09-26 MED ORDER — ALUM & MAG HYDROXIDE-SIMETH 200-200-20 MG/5ML PO SUSP: 30.0000 mL | ORAL | Status: DC | PRN

## 2017-09-26 MED ORDER — ONDANSETRON HCL 4 MG/2ML IJ SOLN
INTRAMUSCULAR | Status: DC | PRN
  Administered 2017-09-26: 4 mg via INTRAVENOUS

## 2017-09-26 SURGICAL SUPPLY — 43 items
ADH SKN CLS APL DERMABOND .7 (GAUZE/BANDAGES/DRESSINGS) ×1
COVER BACK TABLE 60X90IN (DRAPES) ×2 IMPLANT
COVER MAYO STAND STRL (DRAPES) ×2 IMPLANT
DECANTER SPIKE VIAL GLASS SM (MISCELLANEOUS) IMPLANT
DERMABOND ADVANCED (GAUZE/BANDAGES/DRESSINGS) ×1
DERMABOND ADVANCED .7 DNX12 (GAUZE/BANDAGES/DRESSINGS) IMPLANT
DRSG OPSITE POSTOP 3X4 (GAUZE/BANDAGES/DRESSINGS) ×2 IMPLANT
DURAPREP 26ML APPLICATOR (WOUND CARE) ×2 IMPLANT
ELECT REM PT RETURN 9FT ADLT (ELECTROSURGICAL) ×2
ELECTRODE REM PT RTRN 9FT ADLT (ELECTROSURGICAL) ×1 IMPLANT
GLOVE BIOGEL PI IND STRL 6.5 (GLOVE) ×3 IMPLANT
GLOVE BIOGEL PI IND STRL 7.0 (GLOVE) ×2 IMPLANT
GLOVE BIOGEL PI INDICATOR 6.5 (GLOVE) ×3
GLOVE BIOGEL PI INDICATOR 7.0 (GLOVE) ×2
GLOVE ECLIPSE 6.0 STRL STRAW (GLOVE) ×4 IMPLANT
LEGGING LITHOTOMY PAIR STRL (DRAPES) ×2 IMPLANT
LIGASURE VESSEL 5MM BLUNT TIP (ELECTROSURGICAL) ×1 IMPLANT
NS IRRIG 1000ML POUR BTL (IV SOLUTION) ×2 IMPLANT
PACK LAVH (CUSTOM PROCEDURE TRAY) ×2 IMPLANT
PACK ROBOTIC GOWN (GOWN DISPOSABLE) ×2 IMPLANT
PACK TRENDGUARD 450 HYBRID PRO (MISCELLANEOUS) IMPLANT
PROTECTOR NERVE ULNAR (MISCELLANEOUS) ×4 IMPLANT
SET CYSTO W/LG BORE CLAMP LF (SET/KITS/TRAYS/PACK) IMPLANT
SET IRRIG TUBING LAPAROSCOPIC (IRRIGATION / IRRIGATOR) ×1 IMPLANT
SLEEVE XCEL OPT CAN 5 100 (ENDOMECHANICALS) ×3 IMPLANT
SUT MON AB 4-0 PS1 27 (SUTURE) ×2 IMPLANT
SUT VIC AB 0 CT1 18XCR BRD8 (SUTURE) ×2 IMPLANT
SUT VIC AB 0 CT1 27 (SUTURE) ×4
SUT VIC AB 0 CT1 27XBRD ANBCTR (SUTURE) ×2 IMPLANT
SUT VIC AB 0 CT1 8-18 (SUTURE) ×4
SUT VIC AB 2-0 CT1 27 (SUTURE)
SUT VIC AB 2-0 CT1 TAPERPNT 27 (SUTURE) IMPLANT
SUT VIC AB 2-0 SH 27 (SUTURE) ×2
SUT VIC AB 2-0 SH 27XBRD (SUTURE) IMPLANT
SUT VICRYL 0 TIES 12 18 (SUTURE) ×2 IMPLANT
SUT VICRYL 0 UR6 27IN ABS (SUTURE) ×2 IMPLANT
SYR BULB IRRIGATION 50ML (SYRINGE) ×2 IMPLANT
TOWEL OR 17X24 6PK STRL BLUE (TOWEL DISPOSABLE) ×4 IMPLANT
TRAY FOLEY W/BAG SLVR 14FR (SET/KITS/TRAYS/PACK) ×2 IMPLANT
TRENDGUARD 450 HYBRID PRO PACK (MISCELLANEOUS) ×2
TROCAR XCEL NON-BLD 11X100MML (ENDOMECHANICALS) ×2 IMPLANT
TROCAR XCEL NON-BLD 5MMX100MML (ENDOMECHANICALS) ×2 IMPLANT
WARMER LAPAROSCOPE (MISCELLANEOUS) ×2 IMPLANT

## 2017-09-26 NOTE — Brief Op Note (Signed)
09/26/2017  2:09 PM  PATIENT:  Roxy Manns  52 y.o. female  PRE-OPERATIVE DIAGNOSIS:  menorrhagia  POST-OPERATIVE DIAGNOSIS:  menorrhagia  PROCEDURE:  Procedure(s) with comments: LAPAROSCOPIC ASSISTED VAGINAL HYSTERECTOMY WITH SALPINGECTOMY (Bilateral)   SURGEON:  Surgeon(s) and Role:    Jerelyn Charles, MD - Primary    * Bobbye Charleston, MD - Assisting  ANESTHESIA:   general  EBL:  300 mL   BLOOD ADMINISTERED:none  DRAINS: none   LOCAL MEDICATIONS USED:  OTHER vasopressin  SPECIMEN:  Source of Specimen:  uterus, cervix, bilateral tubes  DISPOSITION OF SPECIMEN:  PATHOLOGY  COUNTS:  YES  TOURNIQUET:  * No tourniquets in log *  DICTATION: .Note written in EPIC  PLAN OF CARE: Admit for overnight observation  PATIENT DISPOSITION:  PACU - hemodynamically stable.   Delay start of Pharmacological VTE agent (>24hrs) due to surgical blood loss or risk of bleeding: not applicable

## 2017-09-26 NOTE — Anesthesia Procedure Notes (Signed)
Procedure Name: Intubation Date/Time: 09/26/2017 11:54 AM Performed by: Jonna Munro, CRNA Pre-anesthesia Checklist: Patient identified, Emergency Drugs available, Suction available, Patient being monitored and Timeout performed Patient Re-evaluated:Patient Re-evaluated prior to induction Oxygen Delivery Method: Circle system utilized Preoxygenation: Pre-oxygenation with 100% oxygen Induction Type: IV induction Ventilation: Mask ventilation without difficulty Laryngoscope Size: Mac and 3 Grade View: Grade I Tube type: Oral Tube size: 7.0 mm Number of attempts: 1 Airway Equipment and Method: Stylet Placement Confirmation: ETT inserted through vocal cords under direct vision,  positive ETCO2 and breath sounds checked- equal and bilateral Secured at: 22 cm Tube secured with: Tape Dental Injury: Teeth and Oropharynx as per pre-operative assessment

## 2017-09-26 NOTE — Op Note (Addendum)
PATIENT:  Sara Davidson  52 y.o. female  PRE-OPERATIVE DIAGNOSIS:  menorrhagia  POST-OPERATIVE DIAGNOSIS:  menorrhagia  PROCEDURE:  Procedure(s) with comments: LAPAROSCOPIC ASSISTED VAGINAL HYSTERECTOMY WITH SALPINGECTOMY (Bilateral)   SURGEON:  Surgeon(s) and Role:    Jerelyn Charles, MD - Primary    Bobbye Charleston, MD - Assisting  ANESTHESIA:   general, 13 cc of dilute vasopressin  EBL:  300 mL   FINDINGS:  10 week size fibroid uterus.  Normal fallopian tubes and ovaries bilaterally.    SPECIMEN:  uterus, cervix, bilateral tubes  DESCRIPTION OF THE PROCEDURE  The patient was taken to the operating room, where general endotracheal anesthesia was obtained without difficulty. She was placed in the dorsal lithotomy position in [place] and exam under anesthesia was performed, and an enlarged, fibroid uterus was appreciated. The patient was prepped and draped in the normal sterile fashion. A Foley catheter was inserted sterilely into the bladder. An orogastric tube was placed into the stomach.  A bivalve speculum was placed into the vagina and hulka uterine manipulator was placed without difficulty. Attention was then turned to the abdomen.  The patient had a history of an umbilical hernia repair with mesh, so a left upper quadrant entry was performed.  The scalpel was used to make a 5 mm incision at palmer's point.  The verses was inserted and a low opening pressure was confirmed.  The abdomen was insufflated with carbon dioxide.  After insufflation, the 5 mm Optiview trocar was used to enter the abdomen under direct visualization. The abdomen was surveyed and findings noted as above. Under direct visualization, two additional 5-mm ports were placed into the right and left lower quadrant. The uterus was elevated out of the pelvis.    The LigaSure was then introduced into the abdomen. The left ureter was identified.  The left fallopian tube was grasped and the LigaSure  was used to sequentially clamp, cut, and burn the mesosalpinx. The round ligament and utero-ovarian were then taken in a similar fashion. The anterior leaf of the broad ligament was developed with the LigaSure and dissected down to the level of the bladder. The posterior leaf was taken in a similar manner.   Attention was then turned to the right side. In a similar manner, the LigaSure was used to sequentially clamp, burn the mesosalpinx, freeing up the right fallopian tube.  The round ligament and then the right utero-ovarian were clamped, cut and burned with the LigaSure.   The broad ligament down to level of the uterine arteries.  At the level of the internal os, the uterine arteries were taken bilaterally with the LigaSure device.  The bladder flap was then developed using the LigaSure.    At this point, the decision was made to turn the procedure to a vaginal approach.  All instruments were removed from the abdomen. Attention was turned to the vagina. The uterine manipulator was removed. The cervix was grasped with a single tooth tenaculum and injected circumferentially with dilute pitressin. Bovie cautery was used to circumferentially incise the cervicovaginal junction. The posterior cul-de-sac was entered into with Mayo scissors. The long billed duckbill retractor was then placed into the peritoneal cavity. The the uterosacrals were grasped with a pair heney clamps on either side and secured with a Heaney stitch of 0 Vicryl. The bladder was carefully dissected off of the cervix withsharp dissection with the Metzenbaums.   Curved Heaneys were obtained to sequentially clamp, cut, and suture ligate working up the  cardinal ligaments until entry could be gained anteriorly into the peritoneum. At the level of the cornua,Heaneys were placed bilaterally around the entire pedicle and the uterus was able to be amputated. The pedicles were secured with a free tie of 0 vicryl.   The peritoneum was then closed in  a pursestring fashion, including the bilateral uterosacrals and the posterior vagina in a modified McCall's culdoplasty.  The stitch was pulled taut closing the peritoneum and supporting the vaginal cuff.  The vaginal cuff was closed with#0 Vicryl in a running, locked fashion.    Attention was then turned back to the abdomen. The camera was reinserted. The abdomen was surveyed. It was copiously irrigated.  The pedicles on the left and right side were examined and noted to be hemostatic. The vaginal cuff was re-examined and hemostatic. The ureters were identified and were of normal caliber with normal peristalsis bilaterally.  At this time, all instruments were removed from the abdomen.  The right lower quadrant skin incisions was closed with 4-0 Monocryl in a subcuticular fashion due to bleeding.  All incisions were then closed with Dermabond. The patient tolerated all portions of procedure well. Sponge, lap, and needle count were correct x2.

## 2017-09-26 NOTE — Anesthesia Postprocedure Evaluation (Signed)
Anesthesia Post Note  Patient: Sara Davidson  Procedure(s) Performed: LAPAROSCOPIC ASSISTED VAGINAL HYSTERECTOMY WITH SALPINGECTOMY (Bilateral Abdomen)     Patient location during evaluation: [facility] Anesthesia Type: General Level of consciousness: awake and alert and oriented Pain management: pain level controlled Vital Signs Assessment: post-procedure vital signs reviewed and stable Respiratory status: spontaneous breathing, nonlabored ventilation and respiratory function stable Cardiovascular status: stable Postop Assessment: no apparent nausea or vomiting and adequate PO intake Anesthetic complications: no    Last Vitals:  Vitals:   09/26/17 1556 09/26/17 1658  BP: 110/72 108/68  Pulse: 64 68  Resp: 16 18  Temp: 36.4 C 36.6 C  SpO2: 100% 100%    Last Pain:  Vitals:   09/26/17 1658  TempSrc: Oral  PainSc: 2    Pain Goal: Patients Stated Pain Goal: 2 (09/26/17 1658)               Willa Rough

## 2017-09-26 NOTE — Anesthesia Preprocedure Evaluation (Signed)
Anesthesia Evaluation  Patient identified by MRN, date of birth, ID band Patient awake    Reviewed: Allergy & Precautions, H&P , NPO status , Patient's Chart, lab work & pertinent test results  Airway Mallampati: III  TM Distance: >3 FB Neck ROM: Full    Dental no notable dental hx. (+) Teeth Intact, Dental Advisory Given   Pulmonary neg pulmonary ROS,    Pulmonary exam normal breath sounds clear to auscultation       Cardiovascular negative cardio ROS   Rhythm:Regular Rate:Normal     Neuro/Psych PSYCHIATRIC DISORDERS negative neurological ROS     GI/Hepatic negative GI ROS, Neg liver ROS,   Endo/Other  negative endocrine ROS  Renal/GU negative Renal ROS  negative genitourinary   Musculoskeletal   Abdominal   Peds  Hematology negative hematology ROS (+)   Anesthesia Other Findings   Reproductive/Obstetrics negative OB ROS                             Anesthesia Physical  Anesthesia Plan  ASA: II  Anesthesia Plan: General   Post-op Pain Management:    Induction: Intravenous  PONV Risk Score and Plan: 2 and Ondansetron, Treatment may vary due to age or medical condition and Dexamethasone  Airway Management Planned: Oral ETT  Additional Equipment:   Intra-op Plan:   Post-operative Plan: Extubation in OR  Informed Consent: I have reviewed the patients History and Physical, chart, labs and discussed the procedure including the risks, benefits and alternatives for the proposed anesthesia with the patient or authorized representative who has indicated his/her understanding and acceptance.   Dental advisory given  Plan Discussed with: CRNA, Anesthesiologist and Surgeon  Anesthesia Plan Comments:         Anesthesia Quick Evaluation

## 2017-09-26 NOTE — Progress Notes (Signed)
Patient seen and examined Doing well.  Pain controlled.  Tolerating liquids  BP 108/68 (BP Location: Right Arm)   Pulse 68   Temp 97.8 F (36.6 C) (Oral)   Resp 18   LMP 09/24/2017 (Exact Date)   SpO2 100%   Abd: soft, mildly tender to palpation Incisions c/d/i Peripad w scant heme  A/P:  Advance diet.   Ambulated OOB, then d/c foley and IVF

## 2017-09-26 NOTE — Anesthesia Postprocedure Evaluation (Signed)
Anesthesia Post Note  Patient: Sara Davidson  Procedure(s) Performed: LAPAROSCOPIC ASSISTED VAGINAL HYSTERECTOMY WITH SALPINGECTOMY (Bilateral Abdomen)     Patient location during evaluation: PACU Anesthesia Type: General Level of consciousness: awake and alert Pain management: pain level controlled Vital Signs Assessment: post-procedure vital signs reviewed and stable Respiratory status: spontaneous breathing, nonlabored ventilation, respiratory function stable and patient connected to nasal cannula oxygen Cardiovascular status: blood pressure returned to baseline and stable Postop Assessment: no apparent nausea or vomiting Anesthetic complications: no    Last Vitals:  Vitals:   09/26/17 1545 09/26/17 1556  BP: 104/70 110/72  Pulse: 67 64  Resp: 14 16  Temp:  36.4 C  SpO2: 100% 100%    Last Pain:  Vitals:   09/26/17 1556  TempSrc: Oral  PainSc:    Pain Goal: Patients Stated Pain Goal: 3 (09/26/17 1545)               Jacory Kamel

## 2017-09-26 NOTE — Transfer of Care (Signed)
Immediate Anesthesia Transfer of Care Note  Patient: Sara Davidson  Procedure(s) Performed: LAPAROSCOPIC ASSISTED VAGINAL HYSTERECTOMY WITH SALPINGECTOMY (Bilateral Abdomen)  Patient Location: PACU  Anesthesia Type:General  Level of Consciousness: awake, alert  and oriented  Airway & Oxygen Therapy: Patient Spontanous Breathing and Patient connected to nasal cannula oxygen  Post-op Assessment: Report given to RN and Post -op Vital signs reviewed and stable  Post vital signs: Reviewed and stable  Last Vitals:  Vitals Value Taken Time  BP 120/78 09/26/2017  2:16 PM  Temp    Pulse 96 09/26/2017  2:17 PM  Resp    SpO2 100 % 09/26/2017  2:17 PM  Vitals shown include unvalidated device data.  Last Pain:  Vitals:   09/26/17 0947  TempSrc: Oral  PainSc: 3       Patients Stated Pain Goal: 3 (32/91/91 6606)  Complications: No apparent anesthesia complications

## 2017-09-26 NOTE — Addendum Note (Signed)
Addendum  created 09/26/17 1831 by Jonna Munro, CRNA   Sign clinical note

## 2017-09-26 NOTE — H&P (Signed)
52 y.o. No obstetric history on file. presents for hysterectomy, bilateral salpingectomy.  She has a history of heavy menstrual bleeding.  Menses are monthly, but getting progressively heavier and longer.  Has failed OCPs and LNG-IUD. Ultrasound demonstrates severe small subserosal and intramural fibroids (largest 2.5 cm).  She had an endometrial biopsy in May 2019 that was negative for hyperplasia or cancer.  Hemoglobin in March 2019 was 10.8.  She desires definitive therapy with a hysterectomy.   Past Medical History:  Diagnosis Date  . Anemia   . Anxiety   . Contact lens/glasses fitting    wears contacts or glasses  . Depression   . Headache    migraines    Past Surgical History:  Procedure Laterality Date  . BREAST BIOPSY Right 07/04/2012   Procedure: Right Breast Wire Localized Excision;  Surgeon: Rolm Bookbinder, MD;  Location: [facility];  Service: General;  Laterality: Right;  . CERVICAL CONIZATION W/BX  2004  . FOOT BONE EXCISION Right    RIGHT FOOT 5TH METATARSAL SURGERY WITH PIN  . HERNIA REPAIR  2011   UMB  . INGUINAL HERNIA REPAIR Right    WITH MESH  . KNEE ARTHROSCOPY W/ ACL RECONSTRUCTION  1610,9604   Left  . LESION EXCISION Right    foot  . LIPOMA EXCISION  2008   lt forearm  . LIPOMA EXCISION Left    LEFT ARM  . METATARSAL OSTEOTOMY    . SHOULDER ARTHROSCOPY Left     OB History  Gravida Para Term Preterm AB Living  3 2     1     SAB TAB Ectopic Multiple Live Births  1       2    # Outcome Date GA Lbr Len/2nd Weight Sex Delivery Anes PTL Lv  3 SAB           2 Para           1 Para             Social History   Socioeconomic History  . Marital status: Married    Spouse name: Not on file  . Number of children: Not on file  . Years of education: Not on file  . Highest education level: Not on file  Tobacco Use  . Smoking status: Never Smoker  . Smokeless tobacco: Never Used  Substance and Sexual Activity  . Alcohol use: Not Currently     Alcohol/week: 0.0 oz    Comment: 2/day.  . Drug use: No  . Sexual activity: Not on file   Sulfa antibiotics    Vitals:   09/26/17 0947  BP: 109/86  Pulse: 75  Resp: 16  Temp: 98 F (36.7 C)  SpO2: 100%     General:  NAD Lungs: CTAB Cardiac: RRR Abdomen:  Soft Pelvic: uterus 10 week size no Ex:  no edema  UPT negative in holding  A/P   52 y.o. presents for LAVH/bilateral salpingectomy. Discussed in detail risk, benefits, alternatives to procedures.  She has a history of an umbilical hernia repair with mesh, so will plan LUQ entry.  We discussed risks of LAVH / bilateral salpingectomy to include infection, bleeding, damage to surrounding structures (including but not limited to bowel, bladder, ureters, nerves, vessels, ovaries), conversion to laparotomy, vte / pe, need for blood transfusion, complications of anesthesia, need for additional procedures. She wishes to proceed.  All questions answered.   She understands and elects to proceed.  Consent signed.  UPT  negative in holding.   Ancef preop   [place]

## 2017-09-27 ENCOUNTER — Encounter (HOSPITAL_COMMUNITY): Payer: Self-pay | Admitting: Obstetrics

## 2017-09-27 DIAGNOSIS — Z79899 Other long term (current) drug therapy: Secondary | ICD-10-CM | POA: Diagnosis not present

## 2017-09-27 DIAGNOSIS — N920 Excessive and frequent menstruation with regular cycle: Secondary | ICD-10-CM | POA: Diagnosis not present

## 2017-09-27 DIAGNOSIS — D259 Leiomyoma of uterus, unspecified: Secondary | ICD-10-CM | POA: Diagnosis not present

## 2017-09-27 DIAGNOSIS — Z882 Allergy status to sulfonamides status: Secondary | ICD-10-CM | POA: Diagnosis not present

## 2017-09-27 DIAGNOSIS — N800 Endometriosis of uterus: Secondary | ICD-10-CM | POA: Diagnosis not present

## 2017-09-27 DIAGNOSIS — N888 Other specified noninflammatory disorders of cervix uteri: Secondary | ICD-10-CM | POA: Diagnosis not present

## 2017-09-27 LAB — CBC
HCT: 32.1 % — ABNORMAL LOW (ref 36.0–46.0)
Hemoglobin: 10.2 g/dL — ABNORMAL LOW (ref 12.0–15.0)
MCH: 29.3 pg (ref 26.0–34.0)
MCHC: 31.8 g/dL (ref 30.0–36.0)
MCV: 92.2 fL (ref 78.0–100.0)
PLATELETS: 217 10*3/uL (ref 150–400)
RBC: 3.48 MIL/uL — AB (ref 3.87–5.11)
RDW: 15.1 % (ref 11.5–15.5)
WBC: 10.5 10*3/uL (ref 4.0–10.5)

## 2017-09-27 MED ORDER — IBUPROFEN 600 MG PO TABS: 600.0000 mg | ORAL_TABLET | Freq: Four times a day (QID) | ORAL | 0 refills | Status: DC | PRN

## 2017-09-27 MED ORDER — OXYCODONE HCL 5 MG PO TABS: 5.0000 mg | ORAL_TABLET | Freq: Four times a day (QID) | ORAL | 0 refills | Status: DC | PRN

## 2017-09-27 NOTE — Addendum Note (Signed)
Addendum  created 09/27/17 1309 by Flossie Dibble, CRNA   Charge Capture section accepted

## 2017-09-27 NOTE — Progress Notes (Signed)
Discharge teaching complete with pt. Pt understood all information and did not have any questions. Pt given scheduled ibuprofen before discharge. Pt ambulated out of the hospital and discharged home to family.

## 2017-09-27 NOTE — Discharge Summary (Signed)
Physician Discharge Summary  Patient ID: Sara Davidson MRN: [id number] DOB/AGE: July 30, 1965 52 y.o.  Admit date: 09/26/2017 Discharge date: 09/27/2017  Admission Diagnoses:  Discharge Diagnoses:  Active Problems:   Menorrhagia   Discharged Condition: good  Hospital Course: 52 yo admitted for LAVH / BS for heavy menstrual bleeding.  She had an uncomplicated post operative course.  On POD#1 she was meeting all goals and deemed stable for discharge to home  Discharge Exam: Blood pressure 92/62, pulse (!) 51, temperature 98.8 F (37.1 C), temperature source Oral, resp. rate 16, height 5\' 6"  (1.676 m), weight 52.2 kg (115 lb), last menstrual period 09/24/2017, SpO2 100 %.   Disposition: Discharge disposition: 01-Home or Self Care       Discharge Instructions    Call MD for:  difficulty breathing, headache or visual disturbances   Complete by:  As directed    Call MD for:  extreme fatigue   Complete by:  As directed    Call MD for:  hives   Complete by:  As directed    Call MD for:  persistant dizziness or light-headedness   Complete by:  As directed    Call MD for:  persistant nausea and vomiting   Complete by:  As directed    Call MD for:  redness, tenderness, or signs of infection (pain, swelling, redness, odor or green/yellow discharge around incision site)   Complete by:  As directed    Call MD for:  severe uncontrolled pain   Complete by:  As directed    Call MD for:  temperature >100.4   Complete by:  As directed    Driving restriction   Complete by:  As directed    Avoid driving for at least 2 weeks.   Lifting restrictions   Complete by:  As directed    Weight restriction of 10 lbs.   No dressing needed   Complete by:  As directed    Sexual acrtivity   Complete by:  As directed    Pelvic rest x 6 weeks (no sex or tampons)     Allergies as of 09/27/2017      Reactions   Sulfa Antibiotics Hives      Medication List    STOP taking these medications    amoxicillin-clavulanate 875-125 MG tablet Commonly known as:  AUGMENTIN     TAKE these medications   B-12 3000 MCG Subl Place 3,000 mcg under the tongue every Monday. Sublingual Liquid Dropper   B2 100 MG Tabs Take 200 mg by mouth every evening.   cholecalciferol 1000 units tablet Commonly known as:  VITAMIN D Take 1,000 Units by mouth daily. Sublingual drops   ibuprofen 600 MG tablet Commonly known as:  ADVIL,MOTRIN Take 1 tablet (600 mg total) by mouth every 6 (six) hours as needed. What changed:    medication strength  how much to take  reasons to take this   IRON PO Take 25 mg by mouth daily. Vegan Iron   K2 PLUS D3 PO Take 1 Dose by mouth every Monday, Wednesday, and Friday. Vitamin d3 (1000 units)-Vitamin K2 (25 mcg) per dropperful Take 1 dropperful on Mondays, Wednesdays, & Fridays.   MAG-200 PO Take 200 mg by mouth every evening.   oxyCODONE 5 MG immediate release tablet Commonly known as:  Oxy IR/ROXICODONE Take 1 tablet (5 mg total) by mouth every 6 (six) hours as needed for severe pain.   rizatriptan 10 MG tablet Commonly known as:  MAXALT Take 10  mg by mouth as needed for migraine. May repeat in 2 hours if needed      Follow-up Information    Jerelyn Charles, MD Follow up in 2 week(s).   Specialty:  Obstetrics Why:  post op Contact information: [address] [place] [address] [phone]           Signed: Beryle Quant GEFFEL Lannette Avellino 09/27/2017, 7:42 AM

## 2017-09-27 NOTE — Discharge Instructions (Signed)
You may shower and wash your incisions with soap and water.    Do not soak the incisions --no tub baths or swimming.   Keep incision dry. You may need to keep a sanitary pad   Pelvic rest x 6 weeks (no intercourse or tampons)   No lifting over 10 lbs for 6 weeks.   Do not drive until you are not taking narcotic pain medication AND you can comfortably slam on the brakes.  Please call for heavy vaginal bleeding, worsening abdominal pain, drainage from the incisions, temperature > 101F, shortness of breath, chest pain or any other concerns.

## 2017-09-27 NOTE — Progress Notes (Signed)
Patient is doing well.  She is tolerating PO, ambulating, voiding.  Pain is controlled.  Vaginal bleeding is minimal  Vitals:   09/26/17 1958 09/26/17 2014 09/26/17 2356 09/27/17 0326  BP: 112/74  104/63 92/62  Pulse: 78  (!) 56 (!) 51  Resp: 17  18 16   Temp: 98 F (36.7 C)  98.9 F (37.2 C) 98.8 F (37.1 C)  TempSrc:   Oral Oral  SpO2: 100%  100% 100%  Weight:  52.2 kg (115 lb)    Height:  5\' 6"  (1.676 m)      NAD Abdomen:  soft, appropriate tenderness, incisions intact and without erythema or drainage Pelvic: scant blood on peripad ext:    Symmetric, no edema bilaterally  Lab Results  Component Value Date   WBC 10.5 09/27/2017   HGB 10.2 (L) 09/27/2017   HCT 32.1 (L) 09/27/2017   MCV 92.2 09/27/2017   PLT 217 09/27/2017    A/P    52 y.o. POD 1 s/p LAVH/BS Meeting all goals, d/c to home

## 2017-10-05 DIAGNOSIS — R350 Frequency of micturition: Secondary | ICD-10-CM | POA: Diagnosis not present

## 2017-10-12 DIAGNOSIS — R102 Pelvic and perineal pain: Secondary | ICD-10-CM | POA: Diagnosis not present

## 2017-12-12 ENCOUNTER — Ambulatory Visit: Payer: BLUE CROSS/BLUE SHIELD | Admitting: Family Medicine

## 2017-12-12 ENCOUNTER — Encounter: Payer: Self-pay | Admitting: Family Medicine

## 2017-12-12 ENCOUNTER — Other Ambulatory Visit: Payer: Self-pay

## 2017-12-12 VITALS — BP 96/66 | HR 73 | Temp 98.6°F | Ht 66.0 in | Wt 112.0 lb

## 2017-12-12 DIAGNOSIS — J300 Vasomotor rhinitis: Secondary | ICD-10-CM | POA: Diagnosis not present

## 2017-12-12 DIAGNOSIS — J029 Acute pharyngitis, unspecified: Secondary | ICD-10-CM

## 2017-12-12 LAB — POCT RAPID STREP A (OFFICE): Rapid Strep A Screen: NEGATIVE

## 2017-12-12 MED ORDER — FLUTICASONE PROPIONATE 50 MCG/ACT NA SUSP
1.0000 | Freq: Two times a day (BID) | NASAL | 2 refills | Status: DC
Start: 2017-12-12 — End: 2020-11-11

## 2017-12-12 NOTE — Progress Notes (Signed)
PHKFE

## 2017-12-12 NOTE — Patient Instructions (Addendum)
If you have lab work done today you will be contacted with your lab results within the next 2 weeks.  If you have not heard from [place] then please contact us. The fastest way to get your results is to register for My Chart.   IF you received an x-ray today, you will receive an invoice from [facility]. Please contact [facility] at [phone] with questions or concerns regarding your invoice.   IF you received labwork today, you will receive an invoice from [place]. Please contact [facility] at [phone] with questions or concerns regarding your invoice.   Our billing staff will not be able to assist you with questions regarding bills from these companies.  You will be contacted with the lab results as soon as they are available. The fastest way to get your results is to activate your My Chart account. Instructions are located on the last page of this paperwork. If you have not heard from [place] regarding the results in 2 weeks, please contact this office.     Nonallergic Rhinitis Nonallergic rhinitis is a condition that causes symptoms that affect the nose, such as a runny nose and a stuffed-up nose (nasal congestion) that can make it hard to breathe through the nose. This condition is different from having an allergy (allergic rhinitis). Allergic rhinitis occurs when the body's defense system (immune system) reacts to a substance that you are allergic to (allergen), such as pollen, pet dander, mold, or dust. Nonallergic rhinitis has many similar symptoms, but it is not caused by allergens. Nonallergic rhinitis can be a short-term or long-term problem. What are the causes? This condition can be caused by many different things. Some common types of nonallergic rhinitis include: Infectious rhinitis  This is usually due to an infection in the upper respiratory tract. Vasomotor rhinitis  This is the most common type of long-term nonallergic rhinitis.  It is caused by too  much blood flow through the nose, which makes the tissue inside of the nose swell.  Symptoms are often triggered by strong odors, cold air, stress, drinking alcohol, cigarette smoke, or changes in the weather. Occupational rhinitis  This type is caused by triggers in the workplace, such as chemicals, dusts, animal dander, or air pollution. Hormonal rhinitis  This type occurs in women as a result of an increase in the female hormone estrogen.  It may occur during pregnancy, puberty, and menstrual cycles.  Symptoms improve when estrogen levels drop. Drug-induced rhinitis Several drugs can cause nonallergic rhinitis, including:  Medicines that are used to treat high blood pressure, heart disease, and Parkinson disease.  Aspirin and NSAIDs.  Over-the-counter nasal decongestant sprays. These can cause a type of nonallergic rhinitis (rhinitis medicamentosa) when they are used for more than a few days.  Nonallergic rhinitis with eosinophilia syndrome (NARES)  This type is caused by having too much of a certain type of white blood cell (eosinophil). Nonallergic rhinitis can also be caused by a reaction to eating hot or spicy foods. This does not usually cause long-term symptoms. In some cases, the cause of nonallergic rhinitis is not known. What increases the risk? You are more likely to develop this condition if:  You are 55-17 years of age.  You are a woman. Women are twice as likely to have this condition.  What are the signs or symptoms? Common symptoms of this condition include:  Nasal congestion.  Runny nose.  The feeling of mucus going down the back of the throat (  postnasal drip).  Trouble sleeping at night and daytime sleepiness.  Less common symptoms include:  Sneezing.  Coughing.  Itchy nose.  Bloodshot eyes.  How is this diagnosed? This condition may be diagnosed based on:  Your symptoms and medical history.  A physical exam.  Allergy testing to rule  out allergic rhinitis. You may have skin tests or blood tests.  In some cases, the health care provider may take a swab of nasal secretions to look for an increased number of eosinophils. This would be done to confirm a diagnosis of NARES. How is this treated? Treatment for this condition depends on the cause. No single treatment works for everyone. Work with your health care provider to find the best treatment for you. Treatment may include:  Avoiding the things that trigger your symptoms.  Using medicines to relieve congestion, such as: ? Steroid nasal spray. There are many types. You may need to try a few to find out which one works best. ? Decongestant medicine. This may be an oral medicine or a nasal spray. These medicines are only used for a short time.  Using medicines to relieve a runny nose. These may include antihistamine medicines or anticholinergic nasal sprays.  Surgery to remove tissue from inside the nose may be needed in severe cases if the condition has not improved after 6-12 months of medical treatment. Follow these instructions at home:  Take or use over-the-counter and prescription medicines only as told by your health care provider. Do not stop using your medicine even if you start to feel better.  Use salt-water (saline) rinses or other solutions (nasal washes or irrigations) to wash or rinse out the inside of your nose as told by your health care provider.  Do not take NSAIDs or medicines that contain aspirin if they make your symptoms worse.  Do not drink alcohol if it makes your symptoms worse.  Do not use any tobacco products, such as cigarettes, chewing tobacco, and e-cigarettes. If you need help quitting, ask your health care provider.  Avoid secondhand smoke.  Get some exercise every day. Exercise may help reduce symptoms of nonallergic rhinitis for some people. Ask your health care provider how much exercise and what types of exercise are safe for  you.  Sleep with the head of your bed raised (elevated). This may reduce nighttime nasal congestion.  Keep all follow-up visits as told by your health care provider. This is important. Contact a health care provider if:  You have a fever.  Your symptoms are getting worse at home.  Your symptoms are not responding to medicine.  You develop new symptoms, especially a headache or nosebleed. This information is not intended to replace advice given to you by your health care provider. Make sure you discuss any questions you have with your health care provider. Document Released: 07/26/2015 Document Revised: 09/09/2015 Document Reviewed: 06/24/2015 Elsevier Interactive Patient Education  2018 [employer].

## 2017-12-12 NOTE — Progress Notes (Signed)
8/28/201910:45 AM  Sara Davidson 22-Nov-1965, 52 y.o. female [id number]  Chief Complaint  Patient presents with  . Sore Throat    Sore throat for 9 days with coughing and sinus pressure. Taking mucinex with no resolve    HPI:   Patient is a 52 y.o. female with past medical history significant for migraines who presents today for 9 days of sore throat  over a week ago woke up with a very sore throat Achy, lost of drainage, cough Increased vitamin c and mucinex with cough suppressant and expotorant "I am sneezer" Takes zyrtec when she exercises outside No fevers, no sob No heartburn  Fall Risk  12/12/2017 05/21/2017  Falls in the past year? No No     Depression screen [facility] 2/9 12/12/2017 05/21/2017 08/30/2014  Decreased Interest 0 0 0  Down, Depressed, Hopeless 0 0 0  PHQ - 2 Score 0 0 0    Allergies  Allergen Reactions  . Sulfa Antibiotics Hives    Prior to Admission medications   Medication Sig Start Date End Date Taking? Authorizing Provider  cholecalciferol (VITAMIN D) 1000 units tablet Take 1,000 Units by mouth daily. Sublingual drops   Yes [provider]  Cyanocobalamin (B-12) 3000 MCG SUBL Place 3,000 mcg under the tongue every Monday. Sublingual Liquid Dropper   Yes [provider]  ibuprofen (ADVIL,MOTRIN) 600 MG tablet Take 1 tablet (600 mg total) by mouth every 6 (six) hours as needed. 09/27/17  Yes Jerelyn Charles, MD  Magnesium Oxide (MAG-200 PO) Take 200 mg by mouth every evening.   Yes [provider]  Riboflavin (B2) 100 MG TABS Take 200 mg by mouth every evening.   Yes [provider]  rizatriptan (MAXALT) 10 MG tablet Take 10 mg by mouth as needed for migraine. May repeat in 2 hours if needed   Yes [provider]  Vitamin D-Vitamin K (K2 PLUS D3 PO) Take 1 Dose by mouth every Monday, Wednesday, and Friday. Vitamin d3 (1000 units)-Vitamin K2 (25 mcg) per dropperful Take 1 dropperful on Mondays, Wednesdays, &  Fridays.   Yes [provider]    Past Medical History:  Diagnosis Date  . Anemia   . Anxiety   . Contact lens/glasses fitting    wears contacts or glasses  . Depression   . Headache    migraines    Past Surgical History:  Procedure Laterality Date  . BREAST BIOPSY Right 07/04/2012   Procedure: Right Breast Wire Localized Excision;  Surgeon: Rolm Bookbinder, MD;  Location: [facility];  Service: General;  Laterality: Right;  . CERVICAL CONIZATION W/BX  2004  . FOOT BONE EXCISION Right    RIGHT FOOT 5TH METATARSAL SURGERY WITH PIN  . HERNIA REPAIR  2011   UMB  . INGUINAL HERNIA REPAIR Right    WITH MESH  . KNEE ARTHROSCOPY W/ ACL RECONSTRUCTION  [id number]   Left  . LAPAROSCOPIC VAGINAL HYSTERECTOMY WITH SALPINGECTOMY Bilateral 09/26/2017   Procedure: LAPAROSCOPIC ASSISTED VAGINAL HYSTERECTOMY WITH SALPINGECTOMY;  Surgeon: Jerelyn Charles, MD;  Location: [facility];  Service: Gynecology;  Laterality: Bilateral;  DR Carlis Abbott REQUESTING 3 HOURS OF OR TIME  . LESION EXCISION Right    foot  . LIPOMA EXCISION  2008   lt forearm  . LIPOMA EXCISION Left    LEFT ARM  . METATARSAL OSTEOTOMY    . SHOULDER ARTHROSCOPY Left     Social History   Tobacco Use  . Smoking status: Never Smoker  . Smokeless  tobacco: Never Used  Substance Use Topics  . Alcohol use: Not Currently    Alcohol/week: 0.0 standard drinks    Comment: 2/day.    Family History  Problem Relation Age of Onset  . Cancer Mother        Breast  . Bipolar disorder Father     ROS Per hpi  OBJECTIVE:  Blood pressure 96/66, pulse 73, temperature 98.6 F (37 C), temperature source Oral, height 5\' 6"  (1.676 m), weight 112 lb (50.8 kg), last menstrual period 09/24/2017, SpO2 97 %. Body mass index is 18.08 kg/m.   Physical Exam  Constitutional: She is oriented to person, place, and time. She appears well-developed and well-nourished.  HENT:  Head: Normocephalic and atraumatic.  Right Ear:  Hearing, tympanic membrane, external ear and ear canal normal.  Left Ear: Hearing, tympanic membrane, external ear and ear canal normal.  Nose: Mucosal edema present. No rhinorrhea. Right sinus exhibits no maxillary sinus tenderness. Left sinus exhibits no maxillary sinus tenderness.  Mouth/Throat: Posterior oropharyngeal erythema present. No oropharyngeal exudate.  Eyes: Pupils are equal, round, and reactive to light. Conjunctivae and EOM are normal.  Neck: Neck supple.  Cardiovascular: Normal rate, regular rhythm and normal heart sounds. Exam reveals no gallop and no friction rub.  No murmur heard. Pulmonary/Chest: Effort normal and breath sounds normal. She has no wheezes. She has no rales.  Musculoskeletal: She exhibits no edema.  Lymphadenopathy:    She has no cervical adenopathy.  Neurological: She is alert and oriented to person, place, and time.  Skin: Skin is warm and dry.  Psychiatric: She has a normal mood and affect.  Nursing note and vitals reviewed.   Results for orders placed or performed in visit on 12/12/17 (from the past 24 hour(s))  POCT rapid strep A     Status: None   Collection Time: 12/12/17 10:30 AM  Result Value Ref Range   Rapid Strep A Screen Negative Negative      ASSESSMENT and PLAN  1. Vasomotor rhinitis Discussed supportive measures, new meds r/se/b and RTC precautions. Patient educational handout given. 2. Sore throat - POCT rapid strep A  Other orders - fluticasone (FLONASE) 50 MCG/ACT nasal spray; Place 1 spray into both nostrils 2 (two) times daily.    Return if symptoms worsen or fail to improve.    Rutherford Guys, MD [facility], [place] [address] Ph.  [phone] Fax [phone]

## 2017-12-21 ENCOUNTER — Encounter: Payer: Self-pay | Admitting: Emergency Medicine

## 2017-12-21 ENCOUNTER — Other Ambulatory Visit: Payer: Self-pay

## 2017-12-21 ENCOUNTER — Ambulatory Visit: Payer: BLUE CROSS/BLUE SHIELD | Admitting: Emergency Medicine

## 2017-12-21 VITALS — BP 115/71 | HR 75 | Temp 98.7°F | Resp 16 | Ht 65.0 in | Wt 115.2 lb

## 2017-12-21 DIAGNOSIS — J029 Acute pharyngitis, unspecified: Secondary | ICD-10-CM | POA: Diagnosis not present

## 2017-12-21 MED ORDER — AZITHROMYCIN 250 MG PO TABS: ORAL_TABLET | ORAL | 0 refills | Status: DC

## 2017-12-21 MED ORDER — FLUCONAZOLE 150 MG PO TABS: 150.0000 mg | ORAL_TABLET | Freq: Once | ORAL | 0 refills | Status: AC

## 2017-12-21 NOTE — Patient Instructions (Addendum)
     If you have lab work done today you will be contacted with your lab results within the next 2 weeks.  If you have not heard from us then please contact us. The fastest way to get your results is to register for My Chart.   IF you received an x-ray today, you will receive an invoice from [facility]. Please contact [facility] at [phone] with questions or concerns regarding your invoice.   IF you received labwork today, you will receive an invoice from [facility]. Please contact [facility] at [phone] with questions or concerns regarding your invoice.   Our billing staff will not be able to assist you with questions regarding bills from these companies.  You will be contacted with the lab results as soon as they are available. The fastest way to get your results is to activate your My Chart account. Instructions are located on the last page of this paperwork. If you have not heard from us regarding the results in 2 weeks, please contact this office.     Sore Throat When you have a sore throat, your throat may:  Hurt.  Burn.  Feel irritated.  Feel scratchy.  Many things can cause a sore throat, including:  An infection.  Allergies.  Dryness in the air.  Smoke or pollution.  Gastroesophageal reflux disease (GERD).  A tumor.  A sore throat can be the first sign of another sickness. It can happen with other problems, like coughing or a fever. Most sore throats go away without treatment. Follow these instructions at home:  Take over-the-counter medicines only as told by your doctor.  Drink enough fluids to keep your pee (urine) clear or pale yellow.  Rest when you feel you need to.  To help with pain, try: ? Sipping warm liquids, such as broth, herbal tea, or warm water. ? Eating or drinking cold or frozen liquids, such as frozen ice pops. ? Gargling with a salt-water mixture 3-4 times a day or as needed. To make a salt-water mixture, add -1  tsp of salt in 1 cup of warm water. Mix it until you cannot see the salt anymore. ? Sucking on hard candy or throat lozenges. ? Putting a cool-mist humidifier in your bedroom at night. ? Sitting in the bathroom with the door closed for 5-10 minutes while you run hot water in the shower.  Do not use any tobacco products, such as cigarettes, chewing tobacco, and e-cigarettes. If you need help quitting, ask your doctor. Contact a doctor if:  You have a fever for more than 2-3 days.  You keep having symptoms for more than 2-3 days.  Your throat does not get better in 7 days.  You have a fever and your symptoms suddenly get worse. Get help right away if:  You have trouble breathing.  You cannot swallow fluids, soft foods, or your saliva.  You have swelling in your throat or neck that gets worse.  You keep feeling like you are going to throw up (vomit).  You keep throwing up. This information is not intended to replace advice given to you by your health care provider. Make sure you discuss any questions you have with your health care provider. Document Released: 01/11/2008 Document Revised: 11/28/2015 Document Reviewed: 01/22/2015 Elsevier Interactive Patient Education  2018 [employer]  

## 2017-12-21 NOTE — Progress Notes (Signed)
Sara Davidson 52 y.o.   Chief Complaint  Patient presents with  . Sore Throat    per patient since 12/04/2017    HISTORY OF PRESENT ILLNESS: This is a 52 y.o. female complaining of persistent sore throat for the last 15 days.  No other significant symptoms.  HPI   Prior to Admission medications   Medication Sig Start Date End Date Taking? Authorizing Provider  cholecalciferol (VITAMIN D) 1000 units tablet Take 1,000 Units by mouth daily. Sublingual drops   Yes [provider]  Cyanocobalamin (B-12) 3000 MCG SUBL Place 3,000 mcg under the tongue every Monday. Sublingual Liquid Dropper   Yes [provider]  fluticasone (FLONASE) 50 MCG/ACT nasal spray Place 1 spray into both nostrils 2 (two) times daily. 12/12/17  Yes Rutherford Guys, MD  Magnesium Oxide (MAG-200 PO) Take 200 mg by mouth every evening.   Yes [provider]  Riboflavin (B2) 100 MG TABS Take 200 mg by mouth every evening.   Yes [provider]  rizatriptan (MAXALT) 10 MG tablet Take 10 mg by mouth as needed for migraine. May repeat in 2 hours if needed   Yes [provider]  Vitamin D-Vitamin K (K2 PLUS D3 PO) Take 1 Dose by mouth every Monday, Wednesday, and Friday. Vitamin d3 (1000 units)-Vitamin K2 (25 mcg) per dropperful Take 1 dropperful on Mondays, Wednesdays, & Fridays.   Yes [provider]    Allergies  Allergen Reactions  . Sulfa Antibiotics Hives    Patient Active Problem List   Diagnosis Date Noted  . Menorrhagia 09/26/2017  . Inguinal hernia without mention of obstruction or gangrene, unilateral or unspecified, (not specified as recurrent)-right s/p open repair with mesh 09/01/13 08/21/2013    Past Medical History:  Diagnosis Date  . Anemia   . Anxiety   . Contact lens/glasses fitting    wears contacts or glasses  . Depression   . Headache    migraines    Past Surgical History:  Procedure Laterality Date  . BREAST BIOPSY Right  07/04/2012   Procedure: Right Breast Wire Localized Excision;  Surgeon: Rolm Bookbinder, MD;  Location: [facility];  Service: General;  Laterality: Right;  . CERVICAL CONIZATION W/BX  2004  . FOOT BONE EXCISION Right    RIGHT FOOT 5TH METATARSAL SURGERY WITH PIN  . HERNIA REPAIR  2011   UMB  . INGUINAL HERNIA REPAIR Right    WITH MESH  . KNEE ARTHROSCOPY W/ ACL RECONSTRUCTION  1962,[id number]   Left  . LAPAROSCOPIC VAGINAL HYSTERECTOMY WITH SALPINGECTOMY Bilateral 09/26/2017   Procedure: LAPAROSCOPIC ASSISTED VAGINAL HYSTERECTOMY WITH SALPINGECTOMY;  Surgeon: Jerelyn Charles, MD;  Location: [facility];  Service: Gynecology;  Laterality: Bilateral;  DR Carlis Abbott REQUESTING 3 HOURS OF OR TIME  . LESION EXCISION Right    foot  . LIPOMA EXCISION  2008   lt forearm  . LIPOMA EXCISION Left    LEFT ARM  . METATARSAL OSTEOTOMY    . SHOULDER ARTHROSCOPY Left     Social History   Socioeconomic History  . Marital status: Married    Spouse name: Not on file  . Number of children: Not on file  . Years of education: Not on file  . Highest education level: Not on file  Occupational History  . Not on file  Social Needs  . Financial resource strain: Not on file  . Food insecurity:    Worry: Not on file    Inability: Not on file  .  Transportation needs:    Medical: Not on file    Non-medical: Not on file  Tobacco Use  . Smoking status: Never Smoker  . Smokeless tobacco: Never Used  Substance and Sexual Activity  . Alcohol use: Not Currently    Alcohol/week: 0.0 standard drinks    Comment: 2/day.  . Drug use: No  . Sexual activity: Not on file  Lifestyle  . Physical activity:    Days per week: Not on file    Minutes per session: Not on file  . Stress: Not on file  Relationships  . Social connections:    Talks on phone: Not on file    Gets together: Not on file    Attends religious service: Not on file    Active member of club or organization: Not on file    Attends meetings  of clubs or organizations: Not on file    Relationship status: Not on file  . Intimate partner violence:    Fear of current or ex partner: Not on file    Emotionally abused: Not on file    Physically abused: Not on file    Forced sexual activity: Not on file  Other Topics Concern  . Not on file  Social History Narrative  . Not on file    Family History  Problem Relation Age of Onset  . Cancer Mother        Breast  . Bipolar disorder Father      Review of Systems  Constitutional: Negative.  Negative for chills and fever.  HENT: Positive for congestion and sore throat.   Eyes: Negative.  Negative for discharge and redness.  Respiratory: Negative.  Negative for cough and shortness of breath.   Cardiovascular: Negative.  Negative for chest pain and palpitations.  Gastrointestinal: Negative.  Negative for diarrhea, nausea and vomiting.  Genitourinary: Negative.   Skin: Negative.  Negative for rash.  Neurological: Negative.   Endo/Heme/Allergies: Negative.   All other systems reviewed and are negative.  Vitals:   12/21/17 1108  BP: 115/71  Pulse: 75  Resp: 16  Temp: 98.7 F (37.1 C)  SpO2: 99%     Physical Exam  Constitutional: She is oriented to person, place, and time. She appears well-developed and well-nourished.  HENT:  Head: Normocephalic and atraumatic.  Mouth/Throat: Uvula is midline. No oral lesions. No uvula swelling. Posterior oropharyngeal erythema present. No oropharyngeal exudate, posterior oropharyngeal edema or tonsillar abscesses.  Eyes: Pupils are equal, round, and reactive to light. Conjunctivae and EOM are normal.  Neck: Normal range of motion. Neck supple.  Cardiovascular: Normal rate, regular rhythm and normal heart sounds.  Pulmonary/Chest: Effort normal and breath sounds normal.  Musculoskeletal: Normal range of motion.  Neurological: She is alert and oriented to person, place, and time. No sensory deficit. She exhibits normal muscle tone.    Skin: Skin is warm and dry. Capillary refill takes less than 2 seconds.  Psychiatric: She has a normal mood and affect. Her behavior is normal.  Vitals reviewed.    ASSESSMENT & PLAN: Sara Davidson was seen today for sore throat.  Diagnoses and all orders for this visit:  Sore throat -     azithromycin (ZITHROMAX) 250 MG tablet; Sig as indicated -     fluconazole (DIFLUCAN) 150 MG tablet; Take 1 tablet (150 mg total) by mouth once for 1 dose.  Acute pharyngitis, unspecified etiology -     azithromycin (ZITHROMAX) 250 MG tablet; Sig as indicated -  fluconazole (DIFLUCAN) 150 MG tablet; Take 1 tablet (150 mg total) by mouth once for 1 dose.    Patient Instructions       If you have lab work done today you will be contacted with your lab results within the next 2 weeks.  If you have not heard from [place] then please contact us. The fastest way to get your results is to register for My Chart.   IF you received an x-ray today, you will receive an invoice from [facility]. Please contact [facility] at [phone] with questions or concerns regarding your invoice.   IF you received labwork today, you will receive an invoice from [facility]. Please contact [facility] at [phone] with questions or concerns regarding your invoice.   Our billing staff will not be able to assist you with questions regarding bills from these companies.  You will be contacted with the lab results as soon as they are available. The fastest way to get your results is to activate your My Chart account. Instructions are located on the last page of this paperwork. If you have not heard from [place] regarding the results in 2 weeks, please contact this office.     Sore Throat When you have a sore throat, your throat may:  Hurt.  Burn.  Feel irritated.  Feel scratchy.  Many things can cause a sore throat, including:  An infection.  Allergies.  Dryness in the air.  Smoke or  pollution.  Gastroesophageal reflux disease (GERD).  A tumor.  A sore throat can be the first sign of another sickness. It can happen with other problems, like coughing or a fever. Most sore throats go away without treatment. Follow these instructions at home:  Take over-the-counter medicines only as told by your doctor.  Drink enough fluids to keep your pee (urine) clear or pale yellow.  Rest when you feel you need to.  To help with pain, try: ? Sipping warm liquids, such as broth, herbal tea, or warm water. ? Eating or drinking cold or frozen liquids, such as frozen ice pops. ? Gargling with a salt-water mixture 3-4 times a day or as needed. To make a salt-water mixture, add -1 tsp of salt in 1 cup of warm water. Mix it until you cannot see the salt anymore. ? Sucking on hard candy or throat lozenges. ? Putting a cool-mist humidifier in your bedroom at night. ? Sitting in the bathroom with the door closed for 5-10 minutes while you run hot water in the shower.  Do not use any tobacco products, such as cigarettes, chewing tobacco, and e-cigarettes. If you need help quitting, ask your doctor. Contact a doctor if:  You have a fever for more than 2-3 days.  You keep having symptoms for more than 2-3 days.  Your throat does not get better in 7 days.  You have a fever and your symptoms suddenly get worse. Get help right away if:  You have trouble breathing.  You cannot swallow fluids, soft foods, or your saliva.  You have swelling in your throat or neck that gets worse.  You keep feeling like you are going to throw up (vomit).  You keep throwing up. This information is not intended to replace advice given to you by your health care provider. Make sure you discuss any questions you have with your health care provider. Document Released: 01/11/2008 Document Revised: 11/28/2015 Document Reviewed: 01/22/2015 Elsevier Interactive Patient Education  [employer].  Agustina Caroli, MD [facility]

## 2018-01-02 ENCOUNTER — Ambulatory Visit: Payer: BLUE CROSS/BLUE SHIELD | Admitting: Family Medicine

## 2018-02-11 DIAGNOSIS — R197 Diarrhea, unspecified: Secondary | ICD-10-CM | POA: Diagnosis not present

## 2018-02-11 DIAGNOSIS — R634 Abnormal weight loss: Secondary | ICD-10-CM | POA: Diagnosis not present

## 2018-02-12 DIAGNOSIS — R197 Diarrhea, unspecified: Secondary | ICD-10-CM | POA: Diagnosis not present

## 2018-02-20 DIAGNOSIS — L603 Nail dystrophy: Secondary | ICD-10-CM | POA: Diagnosis not present

## 2018-02-20 DIAGNOSIS — Z85828 Personal history of other malignant neoplasm of skin: Secondary | ICD-10-CM | POA: Diagnosis not present

## 2018-02-20 DIAGNOSIS — M13811 Other specified arthritis, right shoulder: Secondary | ICD-10-CM | POA: Diagnosis not present

## 2018-02-20 DIAGNOSIS — Z9889 Other specified postprocedural states: Secondary | ICD-10-CM | POA: Diagnosis not present

## 2018-03-08 DIAGNOSIS — B351 Tinea unguium: Secondary | ICD-10-CM | POA: Diagnosis not present

## 2018-04-09 DIAGNOSIS — H9042 Sensorineural hearing loss, unilateral, left ear, with unrestricted hearing on the contralateral side: Secondary | ICD-10-CM | POA: Diagnosis not present

## 2018-04-09 DIAGNOSIS — H6122 Impacted cerumen, left ear: Secondary | ICD-10-CM | POA: Diagnosis not present

## 2018-04-15 DIAGNOSIS — F4320 Adjustment disorder, unspecified: Secondary | ICD-10-CM | POA: Diagnosis not present

## 2018-04-25 DIAGNOSIS — F4320 Adjustment disorder, unspecified: Secondary | ICD-10-CM | POA: Diagnosis not present

## 2018-05-02 DIAGNOSIS — F4320 Adjustment disorder, unspecified: Secondary | ICD-10-CM | POA: Diagnosis not present

## 2018-05-13 DIAGNOSIS — Z681 Body mass index (BMI) 19 or less, adult: Secondary | ICD-10-CM | POA: Diagnosis not present

## 2018-05-13 DIAGNOSIS — N762 Acute vulvitis: Secondary | ICD-10-CM | POA: Diagnosis not present

## 2018-05-16 DIAGNOSIS — F4320 Adjustment disorder, unspecified: Secondary | ICD-10-CM | POA: Diagnosis not present

## 2018-06-05 DIAGNOSIS — H5213 Myopia, bilateral: Secondary | ICD-10-CM | POA: Diagnosis not present

## 2018-06-05 DIAGNOSIS — F4320 Adjustment disorder, unspecified: Secondary | ICD-10-CM | POA: Diagnosis not present

## 2018-06-05 DIAGNOSIS — H43813 Vitreous degeneration, bilateral: Secondary | ICD-10-CM | POA: Diagnosis not present

## 2018-06-05 DIAGNOSIS — H524 Presbyopia: Secondary | ICD-10-CM | POA: Diagnosis not present

## 2018-06-19 DIAGNOSIS — F4320 Adjustment disorder, unspecified: Secondary | ICD-10-CM | POA: Diagnosis not present

## 2018-11-07 DIAGNOSIS — G43009 Migraine without aura, not intractable, without status migrainosus: Secondary | ICD-10-CM | POA: Diagnosis not present

## 2018-12-05 DIAGNOSIS — F4320 Adjustment disorder, unspecified: Secondary | ICD-10-CM | POA: Diagnosis not present

## 2018-12-13 IMAGING — CT CT ABD-PELV W/ CM
1 of 3 series · 13 of 32 positions shown, 18 images · IV contrast (iopamidol)
Comparison: CT scan of July 16, 2009.  MRI April 03, 2014.

CLINICAL DATA: Possible umbilical hernia.

EXAM:
CT ABDOMEN AND PELVIS WITH CONTRAST
TECHNIQUE: Multidetector CT imaging of the abdomen and pelvis was performed
using the standard protocol following bolus administration of
intravenous contrast.
CONTRAST:  100mL ZIBDT6-3UU IOPAMIDOL (ZIBDT6-3UU) INJECTION 61%

[Series 2: abd/pelvis w/cm · axial · 0.65mm/px · z∈[-484,-94]mm · 13 of 90 slices shown, 18 images]
[im 6/90  soft-tissue]
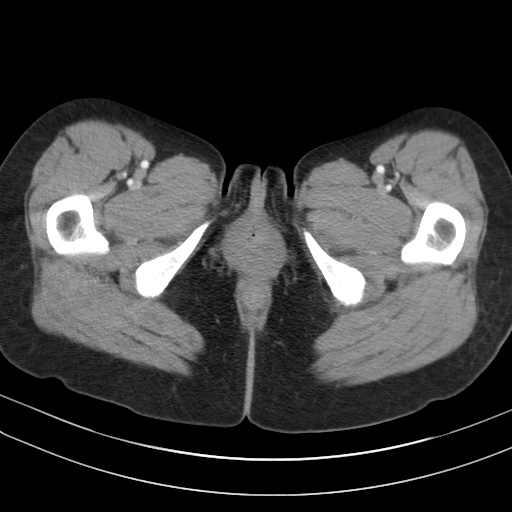
[im 6/90  bone]
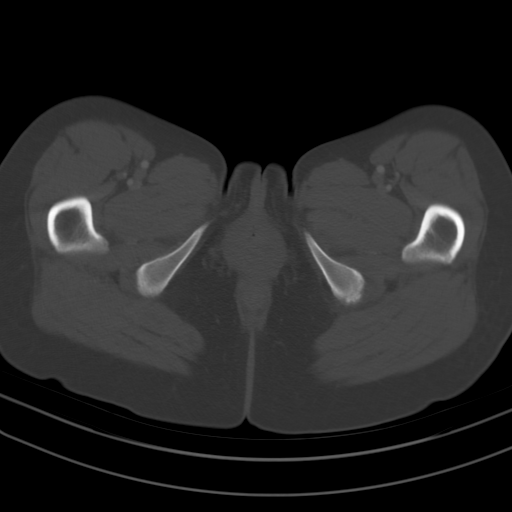
[im 12/90  soft-tissue]
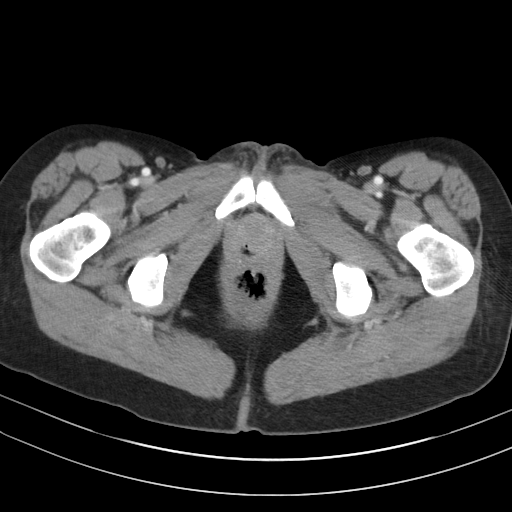
[im 23/90  soft-tissue]
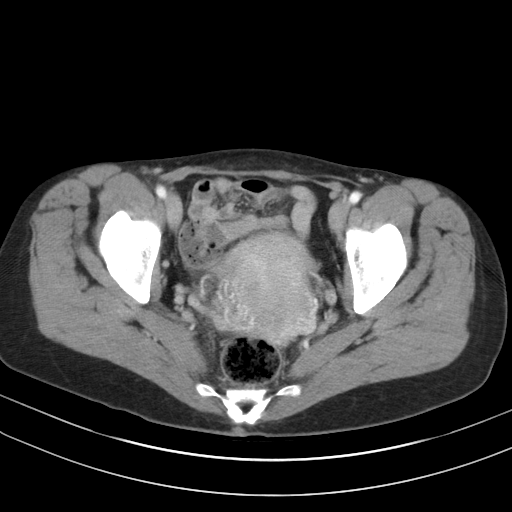
[im 28/90  soft-tissue]
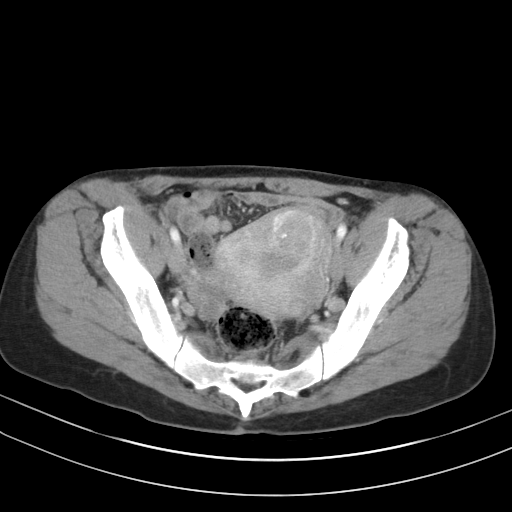
[im 34/90  soft-tissue]
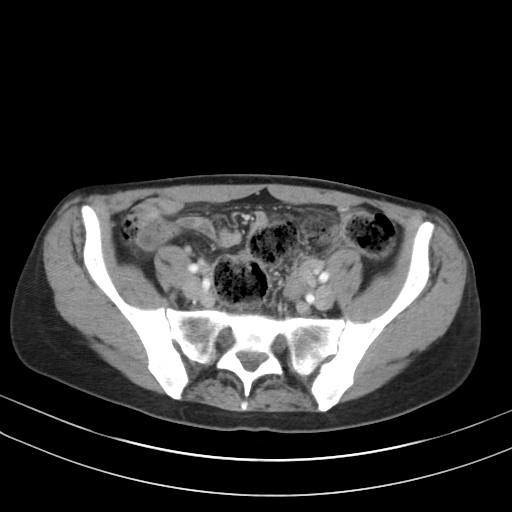
[im 39/90  soft-tissue]
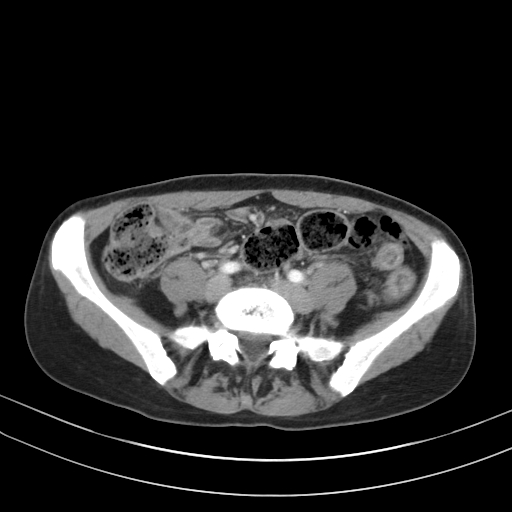
[im 51/90  soft-tissue]
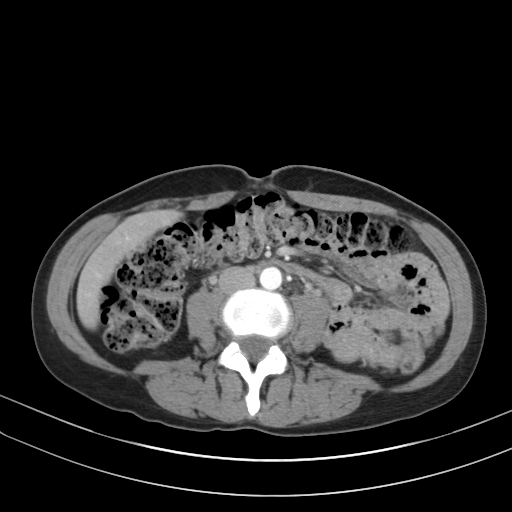
[im 56/90  soft-tissue]
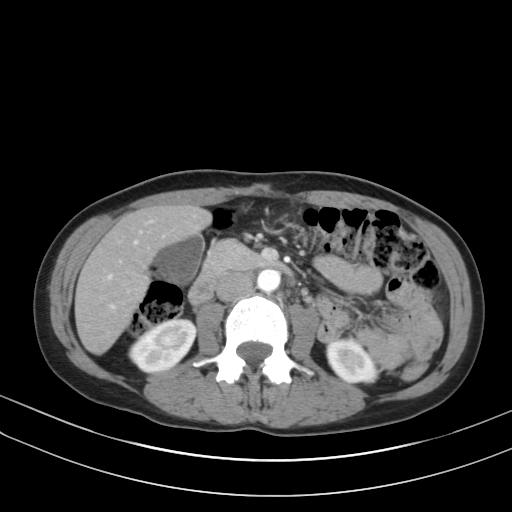
[im 62/90  soft-tissue]
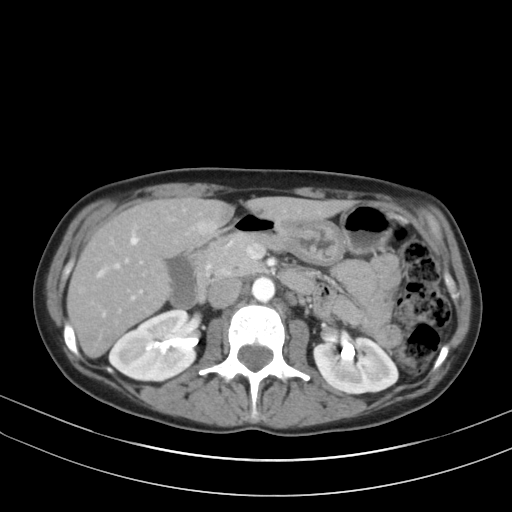
[im 62/90  bone]
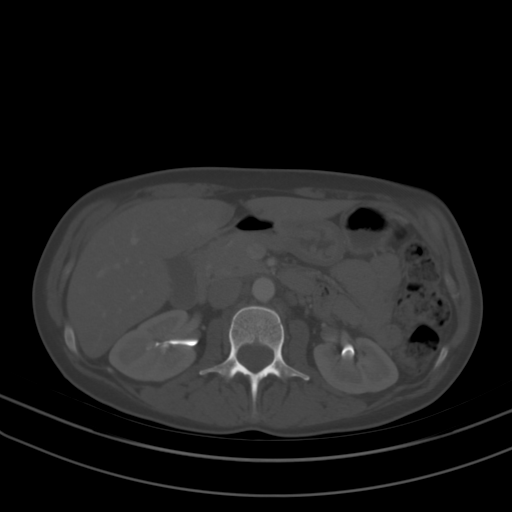
[im 67/90  soft-tissue]
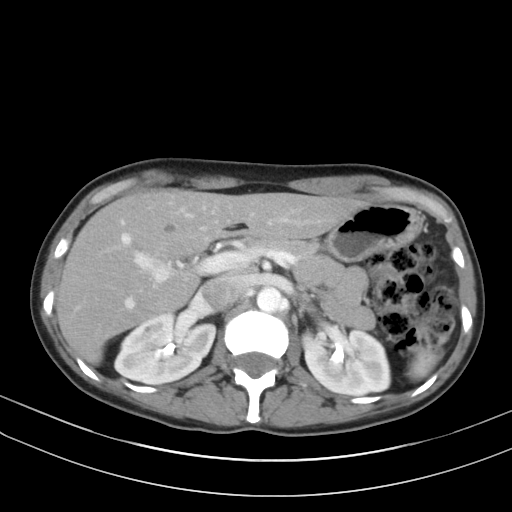
[im 67/90  lung]
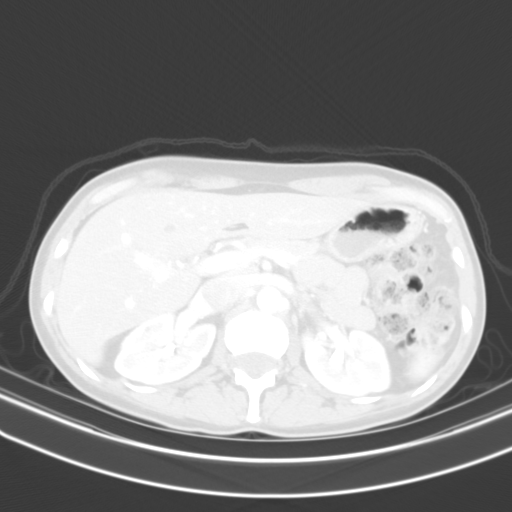
[im 73/90  lung]
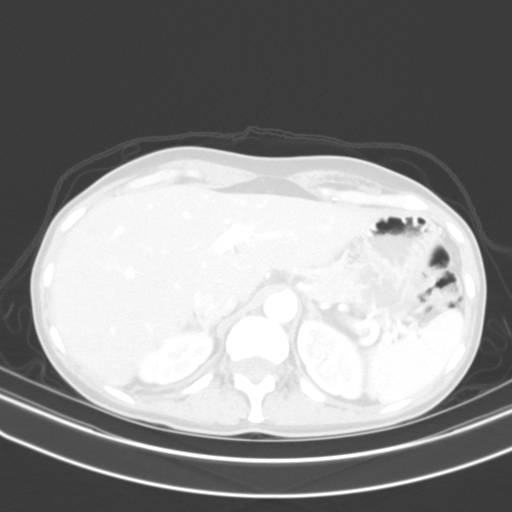
[im 78/90  soft-tissue]
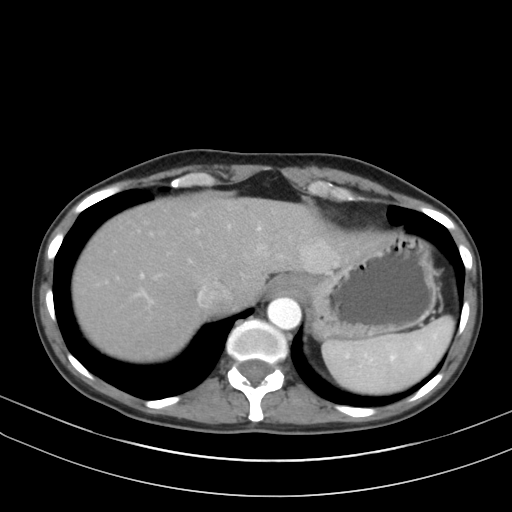
[im 78/90  lung]
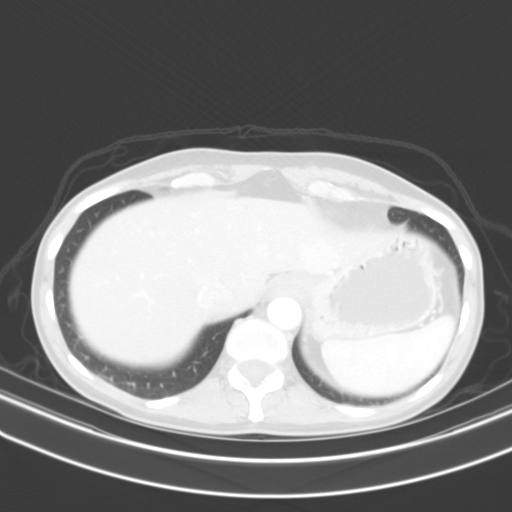
[im 84/90  soft-tissue]
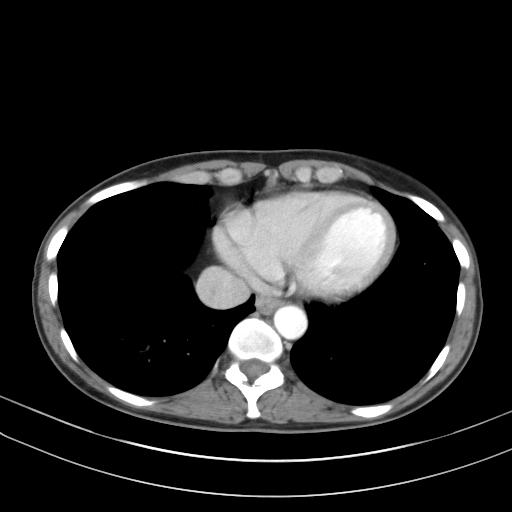
[im 84/90  lung]
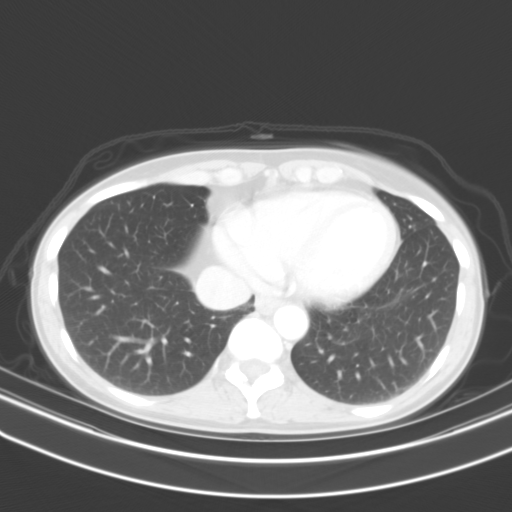

[13 of 32 positions shown; findings below may reference images not displayed]

FINDINGS: Lower chest: No acute abnormality.

Hepatobiliary: No gallstones are noted. No biliary dilatation is
noted. Interval development of at least 4 rounded homogeneously
enhancing abnormality seen in the hepatic parenchyma. 2.6 x 2.4 cm
enhancing structure is noted in the lateral segment of left hepatic
lobe which was present on prior exam. Small cystic abnormality is
noted in central portion of right hepatic lobe which was present on
prior exam.

Pancreas: Unremarkable. No pancreatic ductal dilatation or
surrounding inflammatory changes.

Spleen: Normal in size without focal abnormality.

Adrenals/Urinary Tract: Adrenal glands appear normal. Bilateral
renal cysts are noted.. No hydronephrosis or renal obstruction is
noted. Urinary bladder is decompressed. No renal or ureteral calculi
are noted.

Stomach/Bowel: Stomach is within normal limits. Appendix appears
normal. No evidence of bowel wall thickening, distention, or
inflammatory changes.

Vascular/Lymphatic: No significant vascular findings are present. No
enlarged abdominal or pelvic lymph nodes.

Reproductive: Multiple uterine fibroids are noted, with the largest
measuring 3.4 cm in the fundus. No adnexal abnormality is noted.

Other: No definite inguinal hernia is noted. Stable small fat
containing periumbilical hernia is noted.. No abdominopelvic
ascites.

Musculoskeletal: No acute or significant osseous findings.
IMPRESSION: Stable small fat containing periumbilical hernia is noted.

2.6 cm enhancing structure is noted in left hepatic lobe which was
present on prior exam. However, there has been the interval
development of at least 4 new rounded enhancing abnormalities
predominantly in the right hepatic lobe. These may simply represent
hemangiomas, but they are not diagnostic for this on the basis of
this exam. Further evaluation with MRI of the liver with and without
gadolinium is recommended to rule out other pathology.

Fibroid uterus.

## 2018-12-19 DIAGNOSIS — F4320 Adjustment disorder, unspecified: Secondary | ICD-10-CM | POA: Diagnosis not present

## 2019-01-02 DIAGNOSIS — F4320 Adjustment disorder, unspecified: Secondary | ICD-10-CM | POA: Diagnosis not present

## 2019-01-23 DIAGNOSIS — F4320 Adjustment disorder, unspecified: Secondary | ICD-10-CM | POA: Diagnosis not present

## 2019-02-03 DIAGNOSIS — Z01419 Encounter for gynecological examination (general) (routine) without abnormal findings: Secondary | ICD-10-CM | POA: Diagnosis not present

## 2019-02-03 DIAGNOSIS — Z1231 Encounter for screening mammogram for malignant neoplasm of breast: Secondary | ICD-10-CM | POA: Diagnosis not present

## 2019-02-03 DIAGNOSIS — Z681 Body mass index (BMI) 19 or less, adult: Secondary | ICD-10-CM | POA: Diagnosis not present

## 2019-02-13 DIAGNOSIS — F4320 Adjustment disorder, unspecified: Secondary | ICD-10-CM | POA: Diagnosis not present

## 2019-02-26 DIAGNOSIS — F4320 Adjustment disorder, unspecified: Secondary | ICD-10-CM | POA: Diagnosis not present

## 2019-03-17 DIAGNOSIS — F4320 Adjustment disorder, unspecified: Secondary | ICD-10-CM | POA: Diagnosis not present

## 2019-03-31 DIAGNOSIS — F4320 Adjustment disorder, unspecified: Secondary | ICD-10-CM | POA: Diagnosis not present

## 2019-03-31 DIAGNOSIS — Z20828 Contact with and (suspected) exposure to other viral communicable diseases: Secondary | ICD-10-CM | POA: Diagnosis not present

## 2019-04-08 DIAGNOSIS — F4320 Adjustment disorder, unspecified: Secondary | ICD-10-CM | POA: Diagnosis not present

## 2019-04-14 DIAGNOSIS — F4320 Adjustment disorder, unspecified: Secondary | ICD-10-CM | POA: Diagnosis not present

## 2019-04-28 DIAGNOSIS — F4320 Adjustment disorder, unspecified: Secondary | ICD-10-CM | POA: Diagnosis not present

## 2019-05-26 DIAGNOSIS — F4320 Adjustment disorder, unspecified: Secondary | ICD-10-CM | POA: Diagnosis not present

## 2019-06-16 DIAGNOSIS — F4320 Adjustment disorder, unspecified: Secondary | ICD-10-CM | POA: Diagnosis not present

## 2019-06-23 DIAGNOSIS — F4320 Adjustment disorder, unspecified: Secondary | ICD-10-CM | POA: Diagnosis not present

## 2019-06-30 DIAGNOSIS — F4320 Adjustment disorder, unspecified: Secondary | ICD-10-CM | POA: Diagnosis not present

## 2019-07-03 DIAGNOSIS — R61 Generalized hyperhidrosis: Secondary | ICD-10-CM | POA: Diagnosis not present

## 2019-07-07 DIAGNOSIS — F4320 Adjustment disorder, unspecified: Secondary | ICD-10-CM | POA: Diagnosis not present

## 2019-07-08 DIAGNOSIS — Z0000 Encounter for general adult medical examination without abnormal findings: Secondary | ICD-10-CM | POA: Diagnosis not present

## 2019-07-10 LAB — VITAMIN D 25 HYDROXY (VIT D DEFICIENCY, FRACTURES): Vit D, 25-Hydroxy: 45.4

## 2019-07-10 LAB — LIPID PANEL
Cholesterol: 161 (ref 0–200)
HDL: 43 (ref 35–70)
LDL Cholesterol: 18
Triglycerides: 97 (ref 40–160)

## 2019-07-10 LAB — BASIC METABOLIC PANEL
BUN: 7 (ref 4–21)
CO2: 23 — AB (ref 13–22)
Chloride: 105 (ref 99–108)
Creatinine: 0.6 (ref 0.5–1.1)
Glucose: 82
Potassium: 4.4 (ref 3.4–5.3)
Sodium: 143 (ref 137–147)

## 2019-07-10 LAB — CBC: RBC: 4.47 (ref 3.87–5.11)

## 2019-07-10 LAB — COMPREHENSIVE METABOLIC PANEL
Albumin: 4.7 (ref 3.5–5.0)
Calcium: 9.9 (ref 8.7–10.7)
GFR calc Af Amer: 118
GFR calc non Af Amer: 103
Globulin: 2

## 2019-07-10 LAB — CBC AND DIFFERENTIAL
HCT: 41 (ref 36–46)
Hemoglobin: 13.5 (ref 12.0–16.0)
Neutrophils Absolute: 2.3
Platelets: 267 (ref 150–399)
WBC: 4.2

## 2019-07-10 LAB — HEPATIC FUNCTION PANEL
ALT: 7 (ref 7–35)
AST: 15 (ref 13–35)
Alkaline Phosphatase: 61 (ref 25–125)
Bilirubin, Total: 0.3

## 2019-07-10 LAB — TSH: TSH: 2.73 (ref 0.41–5.90)

## 2019-07-10 LAB — HEMOGLOBIN A1C: Hemoglobin A1C: 5.1

## 2019-07-21 DIAGNOSIS — F4320 Adjustment disorder, unspecified: Secondary | ICD-10-CM | POA: Diagnosis not present

## 2019-07-31 DIAGNOSIS — F4320 Adjustment disorder, unspecified: Secondary | ICD-10-CM | POA: Diagnosis not present

## 2019-08-04 DIAGNOSIS — H9042 Sensorineural hearing loss, unilateral, left ear, with unrestricted hearing on the contralateral side: Secondary | ICD-10-CM | POA: Diagnosis not present

## 2019-08-04 DIAGNOSIS — H6121 Impacted cerumen, right ear: Secondary | ICD-10-CM | POA: Diagnosis not present

## 2019-08-11 DIAGNOSIS — F4320 Adjustment disorder, unspecified: Secondary | ICD-10-CM | POA: Diagnosis not present

## 2019-08-26 DIAGNOSIS — F4322 Adjustment disorder with anxiety: Secondary | ICD-10-CM | POA: Diagnosis not present

## 2019-09-10 DIAGNOSIS — F4322 Adjustment disorder with anxiety: Secondary | ICD-10-CM | POA: Diagnosis not present

## 2019-09-30 DIAGNOSIS — F4322 Adjustment disorder with anxiety: Secondary | ICD-10-CM | POA: Diagnosis not present

## 2019-10-01 DIAGNOSIS — M7989 Other specified soft tissue disorders: Secondary | ICD-10-CM | POA: Diagnosis not present

## 2019-10-01 DIAGNOSIS — R0781 Pleurodynia: Secondary | ICD-10-CM | POA: Diagnosis not present

## 2019-10-13 DIAGNOSIS — F4322 Adjustment disorder with anxiety: Secondary | ICD-10-CM | POA: Diagnosis not present

## 2019-10-27 DIAGNOSIS — F4322 Adjustment disorder with anxiety: Secondary | ICD-10-CM | POA: Diagnosis not present

## 2019-11-10 DIAGNOSIS — F4322 Adjustment disorder with anxiety: Secondary | ICD-10-CM | POA: Diagnosis not present

## 2019-11-24 DIAGNOSIS — F4322 Adjustment disorder with anxiety: Secondary | ICD-10-CM | POA: Diagnosis not present

## 2019-12-08 DIAGNOSIS — F4322 Adjustment disorder with anxiety: Secondary | ICD-10-CM | POA: Diagnosis not present

## 2019-12-09 DIAGNOSIS — G43009 Migraine without aura, not intractable, without status migrainosus: Secondary | ICD-10-CM | POA: Diagnosis not present

## 2020-01-05 DIAGNOSIS — Z20828 Contact with and (suspected) exposure to other viral communicable diseases: Secondary | ICD-10-CM | POA: Diagnosis not present

## 2020-01-20 DIAGNOSIS — H524 Presbyopia: Secondary | ICD-10-CM | POA: Diagnosis not present

## 2020-01-20 DIAGNOSIS — H04123 Dry eye syndrome of bilateral lacrimal glands: Secondary | ICD-10-CM | POA: Diagnosis not present

## 2020-01-20 DIAGNOSIS — H5213 Myopia, bilateral: Secondary | ICD-10-CM | POA: Diagnosis not present

## 2020-02-06 ENCOUNTER — Emergency Department (HOSPITAL_COMMUNITY)
Admission: EM | Admit: 2020-02-06 | Discharge: 2020-02-06 | Disposition: A | Discharge status: Home / Self Care | Payer: BC Managed Care – PPO | Attending: Emergency Medicine | Admitting: Emergency Medicine

## 2020-02-06 ENCOUNTER — Other Ambulatory Visit: Payer: Self-pay

## 2020-02-06 ENCOUNTER — Encounter (HOSPITAL_COMMUNITY): Payer: Self-pay

## 2020-02-06 ENCOUNTER — Emergency Department (HOSPITAL_COMMUNITY): Payer: BC Managed Care – PPO

## 2020-02-06 DIAGNOSIS — K7689 Other specified diseases of liver: Secondary | ICD-10-CM | POA: Diagnosis not present

## 2020-02-06 DIAGNOSIS — N201 Calculus of ureter: Secondary | ICD-10-CM | POA: Diagnosis not present

## 2020-02-06 DIAGNOSIS — N132 Hydronephrosis with renal and ureteral calculous obstruction: Secondary | ICD-10-CM | POA: Diagnosis not present

## 2020-02-06 DIAGNOSIS — N281 Cyst of kidney, acquired: Secondary | ICD-10-CM | POA: Diagnosis not present

## 2020-02-06 DIAGNOSIS — Z86018 Personal history of other benign neoplasm: Secondary | ICD-10-CM | POA: Insufficient documentation

## 2020-02-06 DIAGNOSIS — R1030 Lower abdominal pain, unspecified: Secondary | ICD-10-CM | POA: Diagnosis not present

## 2020-02-06 LAB — URINALYSIS, ROUTINE W REFLEX MICROSCOPIC

## 2020-02-06 LAB — CBC WITH DIFFERENTIAL/PLATELET
Abs Immature Granulocytes: 0.01 10*3/uL (ref 0.00–0.07)
Basophils Absolute: 0 10*3/uL (ref 0.0–0.1)
Basophils Relative: 1 %
Eosinophils Absolute: 0 10*3/uL (ref 0.0–0.5)
Eosinophils Relative: 0 %
HCT: 39.8 % (ref 36.0–46.0)
Hemoglobin: 13.1 g/dL (ref 12.0–15.0)
Immature Granulocytes: 0 %
Lymphocytes Relative: 17 %
Lymphs Abs: 1 10*3/uL (ref 0.7–4.0)
MCH: 30.8 pg (ref 26.0–34.0)
MCHC: 32.9 g/dL (ref 30.0–36.0)
MCV: 93.4 fL (ref 80.0–100.0)
Monocytes Absolute: 0.4 10*3/uL (ref 0.1–1.0)
Monocytes Relative: 6 %
Neutro Abs: 4.5 10*3/uL (ref 1.7–7.7)
Neutrophils Relative %: 76 %
Platelets: 244 10*3/uL (ref 150–400)
RBC: 4.26 MIL/uL (ref 3.87–5.11)
RDW: 11.9 % (ref 11.5–15.5)
WBC: 5.9 10*3/uL (ref 4.0–10.5)
nRBC: 0 % (ref 0.0–0.2)

## 2020-02-06 LAB — COMPREHENSIVE METABOLIC PANEL
ALT: 9 U/L (ref 0–44)
AST: 17 U/L (ref 15–41)
Albumin: 4.2 g/dL (ref 3.5–5.0)
Alkaline Phosphatase: 49 U/L (ref 38–126)
Anion gap: 10 (ref 5–15)
BUN: 6 mg/dL (ref 6–20)
CO2: 24 mmol/L (ref 22–32)
Calcium: 9.4 mg/dL (ref 8.9–10.3)
Chloride: 104 mmol/L (ref 98–111)
Creatinine, Ser: 0.62 mg/dL (ref 0.44–1.00)
GFR, Estimated: >60 mL/min (ref >60)
Glucose, Bld: 118 mg/dL — ABNORMAL HIGH (ref 70–99)
Potassium: 3.2 mmol/L — ABNORMAL LOW (ref 3.5–5.1)
Sodium: 138 mmol/L (ref 135–145)
Total Bilirubin: 1 mg/dL (ref 0.3–1.2)
Total Protein: 6.3 g/dL — ABNORMAL LOW (ref 6.5–8.1)

## 2020-02-06 LAB — LIPASE, BLOOD: Lipase: 30 U/L (ref 11–51)

## 2020-02-06 MED ORDER — ONDANSETRON HCL 4 MG/2ML IJ SOLN
4.0000 mg | Freq: Once | INTRAMUSCULAR | Status: AC
  Administered 2020-02-06: 4 mg via INTRAVENOUS
  Filled 2020-02-06: qty 2

## 2020-02-06 MED ORDER — KETOROLAC TROMETHAMINE 30 MG/ML IJ SOLN
30.0000 mg | Freq: Once | INTRAMUSCULAR | Status: AC
  Administered 2020-02-06: 30 mg via INTRAVENOUS
  Filled 2020-02-06: qty 1

## 2020-02-06 MED ORDER — HYDROMORPHONE HCL 1 MG/ML IJ SOLN
1.0000 mg | Freq: Once | INTRAMUSCULAR | Status: AC
  Administered 2020-02-06: 1 mg via INTRAVENOUS
  Filled 2020-02-06: qty 1

## 2020-02-06 MED ORDER — HYDROCODONE-ACETAMINOPHEN 5-325 MG PO TABS
1.0000 | ORAL_TABLET | Freq: Four times a day (QID) | ORAL | 0 refills | Status: DC | PRN
Start: 2020-02-06 — End: 2020-11-11

## 2020-02-06 MED ORDER — PROMETHAZINE HCL 25 MG/ML IJ SOLN
12.5000 mg | Freq: Once | INTRAMUSCULAR | Status: AC
  Administered 2020-02-06: 12.5 mg via INTRAVENOUS
  Filled 2020-02-06: qty 1

## 2020-02-06 MED ORDER — NAPROXEN 375 MG PO TABS: 375.0000 mg | ORAL_TABLET | Freq: Two times a day (BID) | ORAL | 0 refills | Status: DC

## 2020-02-06 MED ORDER — ONDANSETRON 8 MG PO TBDP: 8.0000 mg | ORAL_TABLET | Freq: Three times a day (TID) | ORAL | 0 refills | Status: DC | PRN

## 2020-02-06 NOTE — ED Notes (Signed)
Patient given water and crackers. 

## 2020-02-06 NOTE — ED Provider Notes (Signed)
[facility] DEPT Provider Note   CSN: [id number] Arrival date & time: 02/06/20  0263     History Chief Complaint  Patient presents with  . Flank Pain  . Abdominal Pain    Sara Davidson is a 54 y.o. female.  HPI   Patient presents to ED with complaints of severe abdominal pain and urinary discomfort.  Patient started having urinary frequency and discomfort yesterday.  She thought she was developing UTI so she called her GYN.  Patient was empirically started on antibiotics.  Patient has been taking Keflex and Pyridium.  She felt like the symptoms got a little bit better last evening and she was able to rest however this morning the symptoms became much more intense and severe.  She is having severe discomfort in her lower abdomen.  Nursing notes indicate she is having right flank pain but when I asked her which side was hurting she said she was not sure.  Pain is in her lower abdomen and goes to her back.  Patient also feels like her bladder is distended and will empty.  Patient called her primary care doctor was instructed to come to ED for further evaluation.  Past Medical History:  Diagnosis Date  . Anemia   . Anxiety   . Contact lens/glasses fitting    wears contacts or glasses  . Depression   . Headache    migraines    Patient Active Problem List   Diagnosis Date Noted  . Sore throat 12/21/2017    Past Surgical History:  Procedure Laterality Date  . BREAST BIOPSY Right 07/04/2012   Procedure: Right Breast Wire Localized Excision;  Surgeon: Rolm Bookbinder, MD;  Location: [facility];  Service: General;  Laterality: Right;  . CERVICAL CONIZATION W/BX  2004  . FOOT BONE EXCISION Right    RIGHT FOOT 5TH METATARSAL SURGERY WITH PIN  . HERNIA REPAIR  2011   UMB  . INGUINAL HERNIA REPAIR Right    WITH MESH  . KNEE ARTHROSCOPY W/ ACL RECONSTRUCTION  [id number]   Left  . LAPAROSCOPIC VAGINAL HYSTERECTOMY WITH  SALPINGECTOMY Bilateral 09/26/2017   Procedure: LAPAROSCOPIC ASSISTED VAGINAL HYSTERECTOMY WITH SALPINGECTOMY;  Surgeon: Jerelyn Charles, MD;  Location: [facility];  Service: Gynecology;  Laterality: Bilateral;  DR Carlis Abbott REQUESTING 3 HOURS OF OR TIME  . LESION EXCISION Right    foot  . LIPOMA EXCISION  2008   lt forearm  . LIPOMA EXCISION Left    LEFT ARM  . METATARSAL OSTEOTOMY    . SHOULDER ARTHROSCOPY Left      OB History    Gravida  3   Para  2   Term      Preterm      AB  1   Living        SAB  1   TAB      Ectopic      Multiple      Live Births  2           Family History  Problem Relation Age of Onset  . Cancer Mother        Breast  . Bipolar disorder Father     Social History   Tobacco Use  . Smoking status: Never Smoker  . Smokeless tobacco: Never Used  Vaping Use  . Vaping Use: Never used  Substance Use Topics  . Alcohol use: Not Currently    Alcohol/week: 0.0 standard drinks  . Drug use: No  Home Medications Prior to Admission medications   Medication Sig Start Date End Date Taking? Authorizing Provider  azithromycin (ZITHROMAX) 250 MG tablet Sig as indicated 12/21/17   Horald Pollen, MD  cholecalciferol (VITAMIN D) 1000 units tablet Take 1,000 Units by mouth daily. Sublingual drops    [provider]  Cyanocobalamin (B-12) 3000 MCG SUBL Place 3,000 mcg under the tongue every Monday. Sublingual Liquid Dropper    [provider]  fluticasone (FLONASE) 50 MCG/ACT nasal spray Place 1 spray into both nostrils 2 (two) times daily. 12/12/17   Rutherford Guys, MD  HYDROcodone-acetaminophen (NORCO/VICODIN) 5-325 MG tablet Take 1 tablet by mouth every 6 (six) hours as needed. 02/06/20   Dorie Rank, MD  Magnesium Oxide (MAG-200 PO) Take 200 mg by mouth every evening.    [provider]  naproxen (NAPROSYN) 375 MG tablet Take 1 tablet (375 mg total) by mouth 2 (two) times daily. 02/06/20   Dorie Rank, MD  ondansetron  (ZOFRAN ODT) 8 MG disintegrating tablet Take 1 tablet (8 mg total) by mouth every 8 (eight) hours as needed for nausea or vomiting. 02/06/20   Dorie Rank, MD  Riboflavin (B2) 100 MG TABS Take 200 mg by mouth every evening.    [provider]  rizatriptan (MAXALT) 10 MG tablet Take 10 mg by mouth as needed for migraine. May repeat in 2 hours if needed    [provider]  Vitamin D-Vitamin K (K2 PLUS D3 PO) Take 1 Dose by mouth every Monday, Wednesday, and Friday. Vitamin d3 (1000 units)-Vitamin K2 (25 mcg) per dropperful Take 1 dropperful on Mondays, Wednesdays, & Fridays.    [provider]    Allergies    Sulfa antibiotics  Review of Systems   Review of Systems  All other systems reviewed and are negative.   Physical Exam Updated Vital Signs BP 124/84   Pulse (!) 56   Temp 97.9 F (36.6 C) (Oral)   Resp 18   Ht 1.676 m (5\' 6" )   Wt 52.2 kg   LMP 09/24/2017 (Exact Date)   SpO2 98%   BMI 18.56 kg/m   Physical Exam Vitals and nursing note reviewed.  Constitutional:      Appearance: She is well-developed. She is ill-appearing.     Comments: Appears to be in pain  HENT:     Head: Normocephalic and atraumatic.     Right Ear: External ear normal.     Left Ear: External ear normal.  Eyes:     General: No scleral icterus.       Right eye: No discharge.        Left eye: No discharge.     Conjunctiva/sclera: Conjunctivae normal.  Neck:     Trachea: No tracheal deviation.  Cardiovascular:     Rate and Rhythm: Normal rate and regular rhythm.  Pulmonary:     Effort: Pulmonary effort is normal. No respiratory distress.     Breath sounds: Normal breath sounds. No stridor. No wheezing or rales.  Abdominal:     General: Bowel sounds are normal. There is no distension.     Palpations: Abdomen is soft.     Tenderness: There is abdominal tenderness in the right lower quadrant, suprapubic area and left lower quadrant. There is right CVA tenderness and left  CVA tenderness. There is no guarding or rebound.  Musculoskeletal:        General: No tenderness.     Cervical back: Neck supple.  Skin:  General: Skin is warm and dry.     Findings: No rash.  Neurological:     Mental Status: She is alert.     Cranial Nerves: No cranial nerve deficit (no facial droop, extraocular movements intact, no slurred speech).     Sensory: No sensory deficit.     Motor: No abnormal muscle tone or seizure activity.     Coordination: Coordination normal.     ED Results / Procedures / Treatments   Labs (all labs ordered are listed, but only abnormal results are displayed) Labs Reviewed  URINALYSIS, ROUTINE W REFLEX MICROSCOPIC - Abnormal; Notable for the following components:      Result Value   Color, Urine RED (*)    APPearance CLOUDY (*)    Glucose, UA   (*)    Value: TEST NOT REPORTED DUE TO COLOR INTERFERENCE OF URINE PIGMENT   Hgb urine dipstick   (*)    Value: TEST NOT REPORTED DUE TO COLOR INTERFERENCE OF URINE PIGMENT   Bilirubin Urine   (*)    Value: TEST NOT REPORTED DUE TO COLOR INTERFERENCE OF URINE PIGMENT   Ketones, ur   (*)    Value: TEST NOT REPORTED DUE TO COLOR INTERFERENCE OF URINE PIGMENT   Protein, ur   (*)    Value: TEST NOT REPORTED DUE TO COLOR INTERFERENCE OF URINE PIGMENT   Nitrite   (*)    Value: TEST NOT REPORTED DUE TO COLOR INTERFERENCE OF URINE PIGMENT   Leukocytes,Ua   (*)    Value: TEST NOT REPORTED DUE TO COLOR INTERFERENCE OF URINE PIGMENT   Bacteria, UA RARE (*)    All other components within normal limits  COMPREHENSIVE METABOLIC PANEL - Abnormal; Notable for the following components:   Potassium 3.2 (*)    Glucose, Bld 118 (*)    Total Protein 6.3 (*)    All other components within normal limits  LIPASE, BLOOD  CBC WITH DIFFERENTIAL/PLATELET    EKG None  Radiology CT Renal Stone Study  Result Date: 02/06/2020 CLINICAL DATA:  Flank pain with kidney stone suspected EXAM: CT ABDOMEN AND PELVIS WITHOUT  CONTRAST TECHNIQUE: Multidetector CT imaging of the abdomen and pelvis was performed following the standard protocol without IV contrast. COMPARISON:  08/03/2017 abdominal CT FINDINGS: Lower chest:  No contributory findings. Hepatobiliary: Liver lesions underestimated relative to prior enhanced CT and MRI. No evident progressive lesion or hemorrhage.No evidence of biliary obstruction or stone. Pancreas: Unremarkable. Spleen: Unremarkable. Adrenals/Urinary Tract: Negative adrenals. Right hydroureteronephrosis due to a 4 x 2 mm UVJ calculus. Two or 3 left lower pole calculi measuring up to 5 mm. Cystic density in the lower pole left kidney. Collapsed urinary bladder. Stomach/Bowel:  No obstruction. No appendicitis. Vascular/Lymphatic: No acute vascular abnormality. No mass or adenopathy. Reproductive:Hysterectomy. Other: No ascites or pneumoperitoneum. Periumbilical hernia repair using mesh Musculoskeletal: No acute abnormalities. IMPRESSION: 1. 4 x 2 mm right UVJ calculus with hydroureteronephrosis. 2. Left renal calculi measuring up to 5 mm. Electronically Signed   By: Monte Fantasia M.D.   On: 02/06/2020 10:33    Procedures Procedures (including critical care time)  Medications Ordered in ED Medications  promethazine (PHENERGAN) injection 12.5 mg (has no administration in time range)  HYDROmorphone (DILAUDID) injection 1 mg (1 mg Intravenous Given 02/06/20 0944)  ondansetron (ZOFRAN) injection 4 mg (4 mg Intravenous Given 02/06/20 0945)  HYDROmorphone (DILAUDID) injection 1 mg (1 mg Intravenous Given 02/06/20 1039)  ketorolac (TORADOL) 30 MG/ML injection 30 mg (30 mg  Intravenous Given 02/06/20 1113)  ondansetron (ZOFRAN) injection 4 mg (4 mg Intravenous Given 02/06/20 1213)    ED Course  I have reviewed the triage vital signs and the nursing notes.  Pertinent labs & imaging results that were available during my care of the patient were reviewed by me and considered in my medical decision making  (see chart for details).  Clinical Course as of Feb 05 1257  Fri Feb 06, 2020  1105 Laboratory tests reviewed.  No significant abnormalities.  CT scan does show a right-sided ureteral stone   [JK]  1252 Labs reviewed.  CBC and metabolic panel normal unremarkable.  Lipase normal   [JK]    Clinical Course User Index [JK] Dorie Rank, MD   MDM Rules/Calculators/A&P                          Initial presentation concerning for the possibility of urinary retention, ureteral colic, less likely pyelonephritis.  Presentation not suggestive of colitis or diverticulitis.  We will proceed with labs bladder scan and CT scan. pain medications have been ordered.  Patient presented with complaints of flank pain concerning for ureteral stone.  Patient has been treated for possible UTI empirically.  Patient was having severe pain and was nauseated.  CT scan did confirm a right-sided ureteral stone.  Patient also incidentally have left-sided kidney stones.  Patient symptoms related to renal colic.  She was provided IV Dilaudid and Toradol.  She was also given antiemetics.  Patient's pain has been controlled.  She still has had some persistent nausea.  Will discharge home with pain medications and antiemetics.  Discussed follow-up with urology as well as return precautions to the ED Final Clinical Impression(s) / ED Diagnoses Final diagnoses:  Right ureteral stone    Rx / DC Orders ED Discharge Orders         Ordered    HYDROcodone-acetaminophen (NORCO/VICODIN) 5-325 MG tablet  Every 6 hours PRN        02/06/20 1256    ondansetron (ZOFRAN ODT) 8 MG disintegrating tablet  Every 8 hours PRN        02/06/20 1256    naproxen (NAPROSYN) 375 MG tablet  2 times daily        02/06/20 1256           Dorie Rank, MD 02/06/20 1258

## 2020-02-06 NOTE — Discharge Instructions (Signed)
Return to the ED for recurrent or worsening symptoms.  Take the medications as needed for pain and nausea.  Use a urine strainer to help determine if you pass the stone.  Please review the discharge directions for additional information

## 2020-02-06 NOTE — ED Triage Notes (Signed)
Patient reports that she was diagnosed with a UTI yesterday and was placed on antibiotics. patient has pain to the lower abdomen and right flank pain. Patient also c/o dysuria and frequency. Patient called PCP and was told to come to r/o Kidney stone.

## 2020-02-26 DIAGNOSIS — N200 Calculus of kidney: Secondary | ICD-10-CM | POA: Diagnosis not present

## 2020-03-05 DIAGNOSIS — N281 Cyst of kidney, acquired: Secondary | ICD-10-CM | POA: Diagnosis not present

## 2020-03-05 DIAGNOSIS — N202 Calculus of kidney with calculus of ureter: Secondary | ICD-10-CM | POA: Diagnosis not present

## 2020-03-05 DIAGNOSIS — N201 Calculus of ureter: Secondary | ICD-10-CM | POA: Diagnosis not present

## 2020-03-05 DIAGNOSIS — N200 Calculus of kidney: Secondary | ICD-10-CM | POA: Diagnosis not present

## 2020-03-05 DIAGNOSIS — R3121 Asymptomatic microscopic hematuria: Secondary | ICD-10-CM | POA: Diagnosis not present

## 2020-03-24 DIAGNOSIS — F4310 Post-traumatic stress disorder, unspecified: Secondary | ICD-10-CM | POA: Diagnosis not present

## 2020-03-31 DIAGNOSIS — F4310 Post-traumatic stress disorder, unspecified: Secondary | ICD-10-CM | POA: Diagnosis not present

## 2020-04-01 DIAGNOSIS — F4310 Post-traumatic stress disorder, unspecified: Secondary | ICD-10-CM | POA: Diagnosis not present

## 2020-04-08 DIAGNOSIS — Z1231 Encounter for screening mammogram for malignant neoplasm of breast: Secondary | ICD-10-CM | POA: Diagnosis not present

## 2020-04-08 DIAGNOSIS — Z01419 Encounter for gynecological examination (general) (routine) without abnormal findings: Secondary | ICD-10-CM | POA: Diagnosis not present

## 2020-04-12 LAB — HM MAMMOGRAPHY

## 2020-04-15 DIAGNOSIS — M1612 Unilateral primary osteoarthritis, left hip: Secondary | ICD-10-CM | POA: Diagnosis not present

## 2020-04-15 DIAGNOSIS — M7061 Trochanteric bursitis, right hip: Secondary | ICD-10-CM | POA: Diagnosis not present

## 2020-04-19 DIAGNOSIS — M7061 Trochanteric bursitis, right hip: Secondary | ICD-10-CM | POA: Diagnosis not present

## 2020-05-03 DIAGNOSIS — F4310 Post-traumatic stress disorder, unspecified: Secondary | ICD-10-CM | POA: Diagnosis not present

## 2020-07-05 DIAGNOSIS — Z0000 Encounter for general adult medical examination without abnormal findings: Secondary | ICD-10-CM | POA: Diagnosis not present

## 2020-07-05 DIAGNOSIS — E538 Deficiency of other specified B group vitamins: Secondary | ICD-10-CM | POA: Diagnosis not present

## 2020-07-05 LAB — CBC AND DIFFERENTIAL
HCT: 41 (ref 36–46)
Hemoglobin: 13.6 (ref 12.0–16.0)
Neutrophils Absolute: 1.9
Platelets: 260 (ref 150–399)
WBC: 3.8

## 2020-07-05 LAB — COMPREHENSIVE METABOLIC PANEL
Albumin: 4.5 (ref 3.5–5.0)
Calcium: 9.5 (ref 8.7–10.7)
GFR calc non Af Amer: 106
Globulin: 2

## 2020-07-05 LAB — CBC: RBC: 4.35 (ref 3.87–5.11)

## 2020-07-05 LAB — VITAMIN D 25 HYDROXY (VIT D DEFICIENCY, FRACTURES): Vit D, 25-Hydroxy: 32.7

## 2020-07-05 LAB — VITAMIN B12: Vitamin B-12: 271

## 2020-07-05 LAB — BASIC METABOLIC PANEL
BUN: 8 (ref 4–21)
CO2: 24 — AB (ref 13–22)
Chloride: 104 (ref 99–108)
Creatinine: 0.6 (ref 0.5–1.1)
Glucose: 88
Potassium: 4.2 (ref 3.4–5.3)
Sodium: 142 (ref 137–147)

## 2020-07-05 LAB — HEPATIC FUNCTION PANEL
ALT: 8 (ref 7–35)
AST: 14 (ref 13–35)
Alkaline Phosphatase: 60 (ref 25–125)
Bilirubin, Total: 0.8

## 2020-07-05 LAB — TSH: TSH: 3.57 (ref <=5.90)

## 2020-07-05 LAB — LIPID PANEL
Cholesterol: 175 (ref 0–200)
HDL: 48 (ref 35–70)
LDL Cholesterol: 111
Triglycerides: 89 (ref 40–160)

## 2020-07-05 LAB — HEMOGLOBIN A1C: Hemoglobin A1C: 5.1

## 2020-08-09 DIAGNOSIS — N760 Acute vaginitis: Secondary | ICD-10-CM | POA: Diagnosis not present

## 2020-08-09 DIAGNOSIS — Z760 Encounter for issue of repeat prescription: Secondary | ICD-10-CM | POA: Diagnosis not present

## 2020-09-20 DIAGNOSIS — F4323 Adjustment disorder with mixed anxiety and depressed mood: Secondary | ICD-10-CM | POA: Diagnosis not present

## 2020-10-07 DIAGNOSIS — F4323 Adjustment disorder with mixed anxiety and depressed mood: Secondary | ICD-10-CM | POA: Diagnosis not present

## 2020-10-13 DIAGNOSIS — F4323 Adjustment disorder with mixed anxiety and depressed mood: Secondary | ICD-10-CM | POA: Diagnosis not present

## 2020-10-20 DIAGNOSIS — F4323 Adjustment disorder with mixed anxiety and depressed mood: Secondary | ICD-10-CM | POA: Diagnosis not present

## 2020-10-21 ENCOUNTER — Other Ambulatory Visit: Payer: Self-pay | Admitting: Orthopedic Surgery

## 2020-10-21 DIAGNOSIS — M1712 Unilateral primary osteoarthritis, left knee: Secondary | ICD-10-CM | POA: Diagnosis not present

## 2020-10-21 DIAGNOSIS — Z9889 Other specified postprocedural states: Secondary | ICD-10-CM | POA: Diagnosis not present

## 2020-10-21 DIAGNOSIS — S83206A Unspecified tear of unspecified meniscus, current injury, right knee, initial encounter: Secondary | ICD-10-CM

## 2020-10-22 ENCOUNTER — Other Ambulatory Visit: Payer: Self-pay

## 2020-10-22 ENCOUNTER — Ambulatory Visit
Admission: RE | Admit: 2020-10-22 | Discharge: 2020-10-22 | Disposition: A | Discharge status: Home / Self Care | Payer: BC Managed Care – PPO | Source: Ambulatory Visit | Attending: Orthopedic Surgery | Admitting: Orthopedic Surgery

## 2020-10-22 DIAGNOSIS — S83206A Unspecified tear of unspecified meniscus, current injury, right knee, initial encounter: Secondary | ICD-10-CM

## 2020-10-22 DIAGNOSIS — M7122 Synovial cyst of popliteal space [Baker], left knee: Secondary | ICD-10-CM | POA: Diagnosis not present

## 2020-10-22 DIAGNOSIS — M25462 Effusion, left knee: Secondary | ICD-10-CM | POA: Diagnosis not present

## 2020-10-26 DIAGNOSIS — M1712 Unilateral primary osteoarthritis, left knee: Secondary | ICD-10-CM | POA: Diagnosis not present

## 2020-10-26 DIAGNOSIS — Z9889 Other specified postprocedural states: Secondary | ICD-10-CM | POA: Diagnosis not present

## 2020-10-27 DIAGNOSIS — F4323 Adjustment disorder with mixed anxiety and depressed mood: Secondary | ICD-10-CM | POA: Diagnosis not present

## 2020-10-29 ENCOUNTER — Ambulatory Visit: Payer: BC Managed Care – PPO | Admitting: Family Medicine

## 2020-11-02 DIAGNOSIS — F4323 Adjustment disorder with mixed anxiety and depressed mood: Secondary | ICD-10-CM | POA: Diagnosis not present

## 2020-11-04 DIAGNOSIS — H9312 Tinnitus, left ear: Secondary | ICD-10-CM | POA: Diagnosis not present

## 2020-11-04 DIAGNOSIS — H903 Sensorineural hearing loss, bilateral: Secondary | ICD-10-CM | POA: Diagnosis not present

## 2020-11-09 DIAGNOSIS — F4323 Adjustment disorder with mixed anxiety and depressed mood: Secondary | ICD-10-CM | POA: Diagnosis not present

## 2020-11-11 ENCOUNTER — Other Ambulatory Visit: Payer: Self-pay

## 2020-11-16 DIAGNOSIS — F4323 Adjustment disorder with mixed anxiety and depressed mood: Secondary | ICD-10-CM | POA: Diagnosis not present

## 2020-11-23 DIAGNOSIS — F4323 Adjustment disorder with mixed anxiety and depressed mood: Secondary | ICD-10-CM | POA: Diagnosis not present

## 2020-11-30 DIAGNOSIS — F4323 Adjustment disorder with mixed anxiety and depressed mood: Secondary | ICD-10-CM | POA: Diagnosis not present

## 2020-12-07 DIAGNOSIS — F4323 Adjustment disorder with mixed anxiety and depressed mood: Secondary | ICD-10-CM | POA: Diagnosis not present

## 2020-12-10 DIAGNOSIS — F4323 Adjustment disorder with mixed anxiety and depressed mood: Secondary | ICD-10-CM | POA: Diagnosis not present

## 2020-12-14 DIAGNOSIS — F4323 Adjustment disorder with mixed anxiety and depressed mood: Secondary | ICD-10-CM | POA: Diagnosis not present

## 2020-12-21 DIAGNOSIS — F4323 Adjustment disorder with mixed anxiety and depressed mood: Secondary | ICD-10-CM | POA: Diagnosis not present

## 2020-12-28 DIAGNOSIS — F4323 Adjustment disorder with mixed anxiety and depressed mood: Secondary | ICD-10-CM | POA: Diagnosis not present

## 2021-01-03 DIAGNOSIS — Z20822 Contact with and (suspected) exposure to covid-19: Secondary | ICD-10-CM | POA: Diagnosis not present

## 2021-01-04 DIAGNOSIS — F4323 Adjustment disorder with mixed anxiety and depressed mood: Secondary | ICD-10-CM | POA: Diagnosis not present

## 2021-01-11 DIAGNOSIS — F4323 Adjustment disorder with mixed anxiety and depressed mood: Secondary | ICD-10-CM | POA: Diagnosis not present

## 2021-01-15 DIAGNOSIS — U071 COVID-19: Secondary | ICD-10-CM

## 2021-01-15 HISTORY — DX: COVID-19: U07.1

## 2021-01-17 DIAGNOSIS — F4323 Adjustment disorder with mixed anxiety and depressed mood: Secondary | ICD-10-CM | POA: Diagnosis not present

## 2021-01-26 DIAGNOSIS — F4323 Adjustment disorder with mixed anxiety and depressed mood: Secondary | ICD-10-CM | POA: Diagnosis not present

## 2021-02-01 DIAGNOSIS — F4323 Adjustment disorder with mixed anxiety and depressed mood: Secondary | ICD-10-CM | POA: Diagnosis not present

## 2021-02-03 DIAGNOSIS — F330 Major depressive disorder, recurrent, mild: Secondary | ICD-10-CM | POA: Diagnosis not present

## 2021-02-04 ENCOUNTER — Ambulatory Visit: Payer: BC Managed Care – PPO | Admitting: Family Medicine

## 2021-02-09 ENCOUNTER — Ambulatory Visit: Payer: BC Managed Care – PPO | Admitting: Family Medicine

## 2021-02-09 DIAGNOSIS — Z20822 Contact with and (suspected) exposure to covid-19: Secondary | ICD-10-CM | POA: Diagnosis not present

## 2021-02-14 DIAGNOSIS — H4423 Degenerative myopia, bilateral: Secondary | ICD-10-CM | POA: Diagnosis not present

## 2021-02-14 DIAGNOSIS — H43813 Vitreous degeneration, bilateral: Secondary | ICD-10-CM | POA: Diagnosis not present

## 2021-02-14 DIAGNOSIS — H5213 Myopia, bilateral: Secondary | ICD-10-CM | POA: Diagnosis not present

## 2021-02-14 DIAGNOSIS — H524 Presbyopia: Secondary | ICD-10-CM | POA: Diagnosis not present

## 2021-02-17 DIAGNOSIS — F4323 Adjustment disorder with mixed anxiety and depressed mood: Secondary | ICD-10-CM | POA: Diagnosis not present

## 2021-02-22 DIAGNOSIS — F4323 Adjustment disorder with mixed anxiety and depressed mood: Secondary | ICD-10-CM | POA: Diagnosis not present

## 2021-03-01 DIAGNOSIS — F4323 Adjustment disorder with mixed anxiety and depressed mood: Secondary | ICD-10-CM | POA: Diagnosis not present

## 2021-03-04 ENCOUNTER — Other Ambulatory Visit: Payer: Self-pay

## 2021-03-04 ENCOUNTER — Ambulatory Visit (INDEPENDENT_AMBULATORY_CARE_PROVIDER_SITE_OTHER): Payer: BC Managed Care – PPO | Admitting: Family Medicine

## 2021-03-04 ENCOUNTER — Encounter: Payer: Self-pay | Admitting: Family Medicine

## 2021-03-04 VITALS — BP 106/68 | HR 77 | Temp 98.4°F | Ht 65.25 in | Wt 119.0 lb

## 2021-03-04 DIAGNOSIS — E2839 Other primary ovarian failure: Secondary | ICD-10-CM | POA: Insufficient documentation

## 2021-03-04 DIAGNOSIS — N952 Postmenopausal atrophic vaginitis: Secondary | ICD-10-CM | POA: Insufficient documentation

## 2021-03-04 DIAGNOSIS — R0781 Pleurodynia: Secondary | ICD-10-CM

## 2021-03-04 DIAGNOSIS — G43709 Chronic migraine without aura, not intractable, without status migrainosus: Secondary | ICD-10-CM | POA: Insufficient documentation

## 2021-03-04 DIAGNOSIS — G43909 Migraine, unspecified, not intractable, without status migrainosus: Secondary | ICD-10-CM | POA: Insufficient documentation

## 2021-03-04 DIAGNOSIS — Z780 Asymptomatic menopausal state: Secondary | ICD-10-CM

## 2021-03-04 DIAGNOSIS — Z7689 Persons encountering health services in other specified circumstances: Secondary | ICD-10-CM

## 2021-03-04 DIAGNOSIS — G43009 Migraine without aura, not intractable, without status migrainosus: Secondary | ICD-10-CM

## 2021-03-04 DIAGNOSIS — Z23 Encounter for immunization: Secondary | ICD-10-CM

## 2021-03-04 DIAGNOSIS — G8929 Other chronic pain: Secondary | ICD-10-CM

## 2021-03-04 DIAGNOSIS — M25562 Pain in left knee: Secondary | ICD-10-CM

## 2021-03-04 HISTORY — DX: Pleurodynia: R07.81

## 2021-03-04 MED ORDER — ESTRADIOL 10 MCG VA TABS
1.0000 | ORAL_TABLET | VAGINAL | 11 refills | Status: DC
Start: 2021-03-07 — End: 2021-08-04

## 2021-03-04 MED ORDER — RIZATRIPTAN BENZOATE 10 MG PO TABS: 10.0000 mg | ORAL_TABLET | ORAL | 11 refills | Status: DC | PRN

## 2021-03-04 NOTE — Patient Instructions (Signed)
Great to meet you today.  I have refilled the medication(s) we provide.   If labs were collected, we will inform you of lab results once received either by echart message or telephone call.   - echart message- for normal results that have been seen by the patient already.   - telephone call: abnormal results or if patient has not viewed results in their echart.   Please help [place] help you:  We are honored you have chosen [facility] for your Primary Care home. Below you will find basic instructions that you may need to access in the future. Please help [place] help you by reading the instructions, which cover many of the frequent questions we experience.   Prescription refills and request:  -In order to allow more efficient response time, please call your pharmacy for all refills. They will forward the request electronically to [place]. This allows for the quickest possible response. Request left on a nurse line can take longer to refill, since these are checked as time allows between office patients and other phone calls.  - refill request can take up to 3-5 working days to complete.  - If request is sent electronically and request is appropiate, it is usually completed in 1-2 business days.  - all patients will need to be seen routinely for all chronic medical conditions requiring prescription medications (see follow-up below). If you are overdue for follow up on your condition, you will be asked to make an appointment and we will call in enough medication to cover you until your appointment (up to 30 days).  - all controlled substances will require a face to face visit to request/refill.  - if you desire your prescriptions to go through a new pharmacy, and have an active script at original pharmacy, you will need to call your pharmacy and have scripts transferred to new pharmacy. This is completed between the pharmacy locations and not by your provider.    Results: Our office handles many  outgoing and incoming calls daily. If we have not contacted you within 1 week about your results, please check your mychart to see if there is a message first and if not, then contact our office.  In helping with this matter, you help decrease call volume, and therefore allow [place] to be able to respond to patients needs more efficiently.  We will always attempt to call you with results,  normal or abnormal. However, if we are unable to reach you we will send a message in your my chart with results.   Acute office visits (sick visit):  An acute visit is intended for a new problem and are scheduled in shorter time slots to allow schedule openings for patients with new problems. This is the appropriate visit to discuss a new problem. Problems will not be addressed by phone call or Echart message. Appointment is needed if requesting treatment. In order to provide you with excellent quality medical care with proper time for you to explain your problem, have an exam and receive treatment with instructions, these appointments should be limited to one new problem per visit. If you experience a new problem, in which you desire to be addressed, please make an acute office visit, we save openings on the schedule to accommodate you. Please do not save your new problem for any other type of visit, let us take care of it properly and quickly for you.   Follow up visits:  Depending on your condition(s) your provider  will need to see you routinely in order to provide you with quality care and prescribe medication(s). Most chronic conditions (Example: hypertension, Diabetes, depression/anxiety... etc), require visits a couple times a year. Your provider will instruct you on proper follow up for your personal medical conditions and history. Please make certain to make follow up appointments for your condition as instructed. Failing to do so could result in lapse in your medication treatment/refills. If you request a refill, and  are overdue to be seen on a condition, we will always provide you with a 30 day script (once) to allow you time to schedule.    Medicare wellness (well visit): - we have a wonderful Nurse Maudie Mercury), that will meet with you and provide you will yearly medicare wellness visits. These visits should occur yearly (can not be scheduled less than 1 calendar year apart) and cover preventive health, immunizations, advance directives and screenings you are entitled to yearly through your medicare benefits. Do not miss out on your entitled benefits, this is when medicare will pay for these benefits to be ordered for you.  These are strongly encouraged by your provider and is the appropriate type of visit to make certain you are up to date with all preventive health benefits. If you have not had your medicare wellness exam in the last 12 months, please make certain to schedule one by calling the office and schedule your medicare wellness with Maudie Mercury as soon as possible.   Yearly physical (well visit):  - Adults are recommended to be seen yearly for physicals. Check with your insurance and date of your last physical, most insurances require one calendar year between physicals. Physicals include all preventive health topics, screenings, medical exam and labs that are appropriate for gender/age and history. You may have fasting labs needed at this visit. This is a well visit (not a sick visit), new problems should not be covered during this visit (see acute visit).  - Pediatric patients are seen more frequently when they are younger. Your provider will advise you on well child visit timing that is appropriate for your their age. - This is not a medicare wellness visit. Medicare wellness exams do not have an exam portion to the visit. Some medicare companies allow for a physical, some do not allow a yearly physical. If your medicare allows a yearly physical you can schedule the medicare wellness with our nurse Maudie Mercury and have your  physical with your provider after, on the same day. Please check with insurance for your full benefits.   Late Policy/No Shows:  - all new patients should arrive 15-30 minutes earlier than appointment to allow [place] time  to  obtain all personal demographics,  insurance information and for you to complete office paperwork. - All established patients should arrive 10-15 minutes earlier than appointment time to update all information and be checked in .  - In our best efforts to run on time, if you are late for your appointment you will be asked to either reschedule or if able, we will work you back into the schedule. There will be a wait time to work you back in the schedule,  depending on availability.  - If you are unable to make it to your appointment as scheduled, please call 24 hours ahead of time to allow [place] to fill the time slot with someone else who needs to be seen. If you do not cancel your appointment ahead of time, you may be charged a no show fee.

## 2021-03-04 NOTE — Progress Notes (Signed)
Patient ID: Sara Davidson, female  DOB: 02/09/1966, 55 y.o.   MRN: [id number] Patient Care Team    Relationship Specialty Notifications Start End  Ma Hillock, DO PCP - General Family Medicine  03/04/21   Norma Fredrickson, MD Consulting Physician Psychiatry  03/04/21   Vickey Huger, MD Consulting Physician Orthopedic Surgery  03/04/21   Rodena Goldmann, [place]  03/04/21   Vania Rea, MD Consulting Physician Obstetrics and Gynecology  03/04/21   Wilford Corner, MD Consulting Physician Gastroenterology  03/04/21   Marygrace Drought, MD Consulting Physician Ophthalmology  03/04/21     Chief Complaint  Patient presents with   Establish Care    [facility]; pt is not fasting    Subjective:  Sara Davidson is a 55 y.o.  female present for new patient establishment. All past medical history, surgical history, allergies, family history, immunizations, medications and social history were updated in the electronic medical record today. All recent labs, ED visits and hospitalizations within the last year were reviewed.  Migraine: Patient reports she uses Maxalt for her migraines, and it works rather well for her.  She states her triggers typically are barometric pressure and different scents that are strong.  Postmenopausal syndrome: She reports hysterectomy secondary to fibroids and menorrhagia.  Her ovaries are intact.  She reports her gynecologist states she is now menopausal secondary to her hormone levels.  She has been prescribed Vagifem tablets twice weekly to help with her atrophic vaginitis secondary to menopause.  She feels this works well for her.  Continues to follow with gynecology as indicated.  Left knee pain/rib discomfort: Patient reports she had a knee injury May 2022.  MRI did not show an acute process although she had a prior ACL repair.  She reports she still has discomfort in her knee and does not feel she has full mobility as she had prior.   She is interested in further evaluation of her knee discomfort and the treatment plan to hopefully help her regain flexibility and strength. She also reports last 6 months she has noticed left lower anterior rib discomfort.  She states that however her ribs feel more uncomfortable.  She states its not so much painful as awareness.  She denies any injury.  She denies any unintentional weight loss, fever or chills.  Depression screen [facility] 2/9 12/21/2017 12/12/2017 05/21/2017 08/30/2014  Decreased Interest 0 0 0 0  Down, Depressed, Hopeless 0 0 0 0  PHQ - 2 Score 0 0 0 0   No flowsheet data found.      Fall Risk  12/21/2017 12/12/2017 05/21/2017  Falls in the past year? No No No   Immunization History  Administered Date(s) Administered   Influenza-Unspecified 01/15/2021   PFIZER(Purple Top)SARS-COV-2 Vaccination 05/31/2019, 06/24/2019, 08/03/2020   Pfizer Covid-19 Vaccine Bivalent Booster 33yrs & up 01/20/2021   Td 08/26/2020   Zoster Recombinat (Shingrix) 08/26/2020, 03/04/2021    No results found.  Past Medical History:  Diagnosis Date   Anemia    Anxiety    Chicken pox    CIN I (cervical intraepithelial neoplasia I)    Contact lens/glasses fitting    wears contacts or glasses   [id number]   Cyclical mastalgia    Depression    Headache    migraines   Papanicolaou smear of vagina with high grade squamous intraepithelial lesion (HGSIL)    Allergies  Allergen Reactions   Sulfa Antibiotics Hives   Past Surgical History:  Procedure Laterality  Date   BREAST BIOPSY Right 07/04/2012   Procedure: Right Breast Wire Localized Excision;  Surgeon: Rolm Bookbinder, MD;  Location: [facility];  Service: General;  Laterality: Right;   CERVICAL CONIZATION W/BX  2004   FOOT BONE EXCISION Right 03/2013   RIGHT FOOT 5TH METATARSAL SURGERY WITH PIN   HERNIA REPAIR  2011   UMB   INGUINAL HERNIA REPAIR Right    WITH MESH   KNEE ARTHROSCOPY W/ ACL RECONSTRUCTION  2774,1287    Left   LAPAROSCOPIC VAGINAL HYSTERECTOMY WITH SALPINGECTOMY Bilateral 09/26/2017   Procedure: LAPAROSCOPIC ASSISTED VAGINAL HYSTERECTOMY WITH SALPINGECTOMY;  Surgeon: Jerelyn Charles, MD;  Location: [facility];  Service: Gynecology;  Laterality: Bilateral;  DR Carlis Abbott REQUESTING 3 HOURS OF OR TIME   LIPOMA EXCISION  2008   lt forearm   METATARSAL OSTEOTOMY     SHOULDER ARTHROSCOPY Left    Family History  Problem Relation Age of Onset   Breast cancer Mother    Bipolar disorder Father    Social History   Social History Narrative   Marital status/children/pets: Married.  2 grown children.   Education/employment: Bachelor's of arts-anthropology.  She is an Training and development officer.   Engineer, materials:      -Wears a bicycle helmet riding a bike: Yes     -smoke alarm in the home:Yes     - wears seatbelt: Yes     - Feels safe in their relationships: Yes       Allergies as of 03/04/2021       Reactions   Sulfa Antibiotics Hives        Medication List        Accurate as of March 04, 2021  1:44 PM. If you have any questions, ask your nurse or doctor.          B-12 3000 MCG Subl Place 3,000 mcg under the tongue every Monday. Sublingual Liquid Dropper   B2 100 MG Tabs Take 200 mg by mouth every evening.   buPROPion 150 MG 24 hr tablet Commonly known as: WELLBUTRIN XL Take 150 mg by mouth daily.   cholecalciferol 1000 units tablet Commonly known as: VITAMIN D Take 1,000 Units by mouth daily. Sublingual drops   Estradiol 10 MCG Tabs vaginal tablet Commonly known as: Vagifem Place 1 tablet (10 mcg total) vaginally 2 (two) times a week. Start taking on: March 07, 2021 What changed: how much to take Changed by: Howard Pouch, DO   K2 PLUS D3 PO Take 1 Dose by mouth every Monday, Wednesday, and Friday. Vitamin d3 (1000 units)-Vitamin K2 (25 mcg) per dropperful Take 1 dropperful on Mondays, Wednesdays, & Fridays.   MAG-200 PO Take 200 mg by mouth every evening.   rizatriptan 10 MG  tablet Commonly known as: MAXALT Take 1 tablet (10 mg total) by mouth as needed for migraine. May repeat in 2 hours if needed        All past medical history, surgical history, allergies, family history, immunizations andmedications were updated in the EMR today and reviewed under the history and medication portions of their EMR.    No results found for this or any previous visit (from the past 2160 hour(s)).  MR KNEE LEFT WO CONTRAST Result Date: 10/25/2020 IMPRESSION: 1. Prior ACL repair with the ACL graft intact. 2. Mild partial-thickness cartilage loss of the medial and lateral femorotibial compartments. 3. Small partially ruptured/leaking Baker's cyst. 4. Moderate joint effusion. 5. Small focal linear signal abnormality at the posterior horn-body junction of the  medial meniscus along the undersurface likely reflecting postsurgical changes versus a small tear.   ROS: 14 pt review of systems performed and negative (unless mentioned in an HPI)  Objective: BP 106/68   Pulse 77   Temp 98.4 F (36.9 C) (Oral)   Ht 5' 5.25" (1.657 m)   Wt 119 lb (54 kg)   LMP 09/24/2017 (Exact Date)   SpO2 99%   BMI 19.65 kg/m  Gen: Afebrile. No acute distress. Nontoxic in appearance, well-developed, well-nourished,  pleasant female.  HENT: AT. [place]. no Cough on exam, no hoarseness on exam. Eyes:Pupils Equal Round Reactive to light, Extraocular movements intact,  Conjunctiva without redness, discharge or icterus. Neck/lymp/endocrine: Supple CV: RRR no murmur, no edema Chest: CTAB, no wheeze, rhonchi or crackles. normal Respiratory effort. good Air movement. Skin: Warm and well-perfused. Skin intact. Neuro/Msk: Normal gait. PERLA. EOMi. Alert. Oriented x3. Psych: Normal affect, dress and demeanor. Normal speech. Normal thought content and judgment.  Assessment/plan: Sara Davidson is a 55 y.o. female present for est care Establishing care with new doctor, encounter for Migraine without aura  and without status migrainosus, not intractable Stable Continue Maxalt as needed  Postmenopausal/Atrophic vaginitis She will continue her pelvic exams with her gynecologist Vagifem continued for her.  Chronic pain of left knee/Rib pain Seems like she would benefit from sports medicine for her left knee pain.  She is agreeable to this today. -Her lower left rib pain is vague in nature, sounds more costochondral/rib somatic dysfunction in nature.  May benefit from sports medicine/OMT. - Ambulatory referral to Sports Medicine  Estrogen deficiency Discussed ordering a bone density and she is estrogen deficient and concerned about osteoporosis.  Her mother had rather significant osteoporosis. - DG Bone Density; Future  Need for zoster vaccine: Completed her Shingrix No. 2 for her today.  Return in about 3 months (around 06/04/2021) for CPE (30 min).  Orders Placed This Encounter  Procedures   Varicella-zoster vaccine IM    Meds ordered this encounter  Medications   Estradiol (VAGIFEM) 10 MCG TABS vaginal tablet    Sig: Place 1 tablet (10 mcg total) vaginally 2 (two) times a week.    Dispense:  8 tablet    Refill:  11   rizatriptan (MAXALT) 10 MG tablet    Sig: Take 1 tablet (10 mg total) by mouth as needed for migraine. May repeat in 2 hours if needed    Dispense:  10 tablet    Refill:  11    Referral Orders  No referral(s) requested today     Note is dictated utilizing voice recognition software. Although note has been proof read prior to signing, occasional typographical errors still can be missed. If any questions arise, please do not hesitate to call for verification.  Electronically signed by: Howard Pouch, DO [place]

## 2021-03-08 DIAGNOSIS — F4323 Adjustment disorder with mixed anxiety and depressed mood: Secondary | ICD-10-CM | POA: Diagnosis not present

## 2021-03-08 NOTE — Progress Notes (Signed)
Sara Davidson D.[place] Phone: [phone]   Assessment and Plan:    1. Chronic pain of left knee 2. History of repair of ACL -Chronic with exacerbation, initial visit - Chronic left knee pain with history of left ACL repair with revision, decreased muscular strength and range of motion - Symptoms are generally improving with personal trainer and patient restarting activities.  Continue this treatment plan and will treat knee flares as needed - Tylenol/NSAIDs as needed for pain control  3. Costochondritis 4. Somatic dysfunction of cervical region 5. Somatic dysfunction of thoracic region 6. Somatic dysfunction of lumbar region 7. Somatic dysfunction of pelvic region 8. Somatic dysfunction of rib region -Chronic exacerbation, initial visit - Multiple chronic musculoskeletal complaints with pain with deep inhalation along ribs bilaterally, upper thoracic pain without red flag symptoms - Patient interested in trying OMT.  Tolerated well per note below  - Patient has received significant relief with OMT in the past.  Elects for repeat OMT today.  Tolerated well per note below. - Decision today to treat with OMT was based on Physical Exam  After verbal consent patient was treated with HVLA (high velocity low amplitude), ME (muscle energy), FPR (flex positional release), ST (soft tissue), PC/PD (Pelvic Compression/ Pelvic Decompression) techniques in cervical, rib, thoracic, lumbar, and pelvic areas. Patient tolerated the procedure well with improvement in symptoms.  Patient educated on potential side effects of soreness and recommended to rest, hydrate, and use Tylenol as needed for pain control.     Pertinent previous records reviewed include PCP note, MRI left knee 10/22/2020, MRI right knee 09/21/2014   Follow Up: 2 weeks for reevaluation.  We will repeat OMT if patient felt improvement.  If no improvement or  worsening of symptoms could obtain chest/rib x-ray   Subjective:   I, Sara Davidson, am serving as a scribe for Dr. Glennon Davidson  Chief Complaint: Left knee pain and rib pain   HPI:   03/09/21 Patient is a 55 year old female presenting with left knee pain and rib pain that has been going on for about 6 months. Patient states knee pain started with an injury he has in May 2022, had an MRI that did not show anything, but patient has had a previous ACL repair. Patient states the pain is more of a discomfort and feels like she does not have full mobility. Patient states the she has been having left lower anterior rib discomfort with no MOI. Patient was seen by her PCP on 03/04/21 for this reason and was referred to sports medicine for evaluation and treatment. ROM in knee is decreased. Just started working out again. Rib discomfort when talking a deep breath. Right hip pain as well on and off for several years.   Relevant Historical Information: History of left knee ACL repair  Additional pertinent review of systems negative.   Current Outpatient Medications:    Bacillus Coagulans-Inulin (PROBIOTIC-PREBIOTIC PO), Take by mouth., Disp: , Rfl:    buPROPion (WELLBUTRIN XL) 150 MG 24 hr tablet, Take 150 mg by mouth daily., Disp: , Rfl:    cetirizine (ZYRTEC) 10 MG tablet, Take 10 mg by mouth daily., Disp: , Rfl:    cholecalciferol (VITAMIN D) 1000 units tablet, Take 1,000 Units by mouth daily. Sublingual drops, Disp: , Rfl:    Cyanocobalamin (B-12) 3000 MCG SUBL, Place 3,000 mcg under the tongue every Monday. Sublingual Liquid Dropper, Disp: , Rfl:  Estradiol (VAGIFEM) 10 MCG TABS vaginal tablet, Place 1 tablet (10 mcg total) vaginally 2 (two) times a week., Disp: 8 tablet, Rfl: 11   Magnesium Oxide (MAG-200 PO), Take 200 mg by mouth every evening., Disp: , Rfl:    OVER THE COUNTER MEDICATION, [employer] softgels, Disp: , Rfl:    Riboflavin (B2) 100 MG TABS, Take 200 mg by mouth every  evening., Disp: , Rfl:    rizatriptan (MAXALT) 10 MG tablet, Take 1 tablet (10 mg total) by mouth as needed for migraine. May repeat in 2 hours if needed, Disp: 10 tablet, Rfl: 11   TURMERIC-GINGER PO, Take by mouth., Disp: , Rfl:    Vitamin D-Vitamin K (K2 PLUS D3 PO), Take 1 Dose by mouth every Monday, Wednesday, and Friday. Vitamin d3 (1000 units)-Vitamin K2 (25 mcg) per dropperful Take 1 dropperful on Mondays, Wednesdays, & Fridays., Disp: , Rfl:    Objective:     Vitals:   03/09/21 1031  BP: 130/86  Pulse: 75  SpO2: 98%  Weight: 123 lb (55.8 kg)  Height: 5\' 5"  (1.651 m)      Body mass index is 20.47 kg/m.    Physical Exam:    General: Well-appearing, cooperative, sitting comfortably in no acute distress.   OMT Physical Exam:  ASIS Compression Test: Positive Right Cervical: TTP paraspinal, C3-5 RRSR Rib: Inhalation dysfunction of bilateral ribs 6-8 Thoracic: TTP paraspinal, T3 RRSR, T6-8 RRSL Lumbar: TTP paraspinal, L5 rotated right Pelvis: Right anterior innominate    Electronically signed by:  Sara Davidson D.[facility] 11:15 AM 03/09/21

## 2021-03-09 ENCOUNTER — Ambulatory Visit: Payer: BC Managed Care – PPO | Admitting: Sports Medicine

## 2021-03-09 ENCOUNTER — Other Ambulatory Visit: Payer: Self-pay

## 2021-03-09 VITALS — BP 130/86 | HR 75 | Ht 65.0 in | Wt 123.0 lb

## 2021-03-09 DIAGNOSIS — Z9889 Other specified postprocedural states: Secondary | ICD-10-CM | POA: Diagnosis not present

## 2021-03-09 DIAGNOSIS — M9908 Segmental and somatic dysfunction of rib cage: Secondary | ICD-10-CM

## 2021-03-09 DIAGNOSIS — M9901 Segmental and somatic dysfunction of cervical region: Secondary | ICD-10-CM

## 2021-03-09 DIAGNOSIS — M9905 Segmental and somatic dysfunction of pelvic region: Secondary | ICD-10-CM

## 2021-03-09 DIAGNOSIS — G8929 Other chronic pain: Secondary | ICD-10-CM

## 2021-03-09 DIAGNOSIS — M25562 Pain in left knee: Secondary | ICD-10-CM | POA: Diagnosis not present

## 2021-03-09 DIAGNOSIS — M9902 Segmental and somatic dysfunction of thoracic region: Secondary | ICD-10-CM

## 2021-03-09 DIAGNOSIS — M940 Chondrocostal junction syndrome [Tietze]: Secondary | ICD-10-CM | POA: Diagnosis not present

## 2021-03-09 DIAGNOSIS — M9903 Segmental and somatic dysfunction of lumbar region: Secondary | ICD-10-CM

## 2021-03-09 NOTE — Patient Instructions (Signed)
Follow up in 2 weeks for OMT

## 2021-03-15 DIAGNOSIS — F4323 Adjustment disorder with mixed anxiety and depressed mood: Secondary | ICD-10-CM | POA: Diagnosis not present

## 2021-03-18 ENCOUNTER — Telehealth: Payer: Self-pay

## 2021-03-18 NOTE — Telephone Encounter (Signed)
Patient needs location changed on bone density scan.   Patient has appt with [place] location on 03/23/21 at 2:20PM  Please send to [facility] so she can follow thru referral/scheduling process to verify if patient insurance is going to require prior [place]

## 2021-03-21 NOTE — Telephone Encounter (Signed)
Please advise. No note noted.

## 2021-03-21 NOTE — Telephone Encounter (Signed)
Sorry.  She does not need permission from provider to change imaging centers. Please change to her image center of choice.  As long as she is aware that it may or may not be in her "network ".

## 2021-03-21 NOTE — Telephone Encounter (Signed)
Please advise if okay to put in new order.

## 2021-03-22 DIAGNOSIS — F4323 Adjustment disorder with mixed anxiety and depressed mood: Secondary | ICD-10-CM | POA: Diagnosis not present

## 2021-03-22 NOTE — Telephone Encounter (Signed)
Order faxed. Pt aware that she will need to be in contact with her insurance in regards to issue with being out of network.

## 2021-03-23 ENCOUNTER — Ambulatory Visit: Payer: BC Managed Care – PPO | Admitting: Family Medicine

## 2021-03-23 NOTE — Progress Notes (Signed)
Benito Mccreedy D.[place] Phone: [phone]   Assessment and Plan:     1. Chronic pain of left knee 2. History of repair of ACL -Chronic with exacerbation, subsequent visit - Chronic left knee pain moderately improved from acute flare after relative rest for the past 5 weeks - Continue work with Physiological scientist for knee stretching, strengthening - Continue Tylenol/NSAIDs as needed for pain control  3. Costochondritis 4. Somatic dysfunction of thoracic region 5. Somatic dysfunction of lumbar region 6. Somatic dysfunction of pelvic region -Chronic with exacerbation, subsequent visit - Multiple chronic musculoskeletal complaints with most prominent being along left-sided rib cage likely from thoracic dysfunction - Patient has received significant relief with OMT in the past.  Elects for repeat OMT today.  Tolerated well per note below. - Decision today to treat with OMT was based on Physical Exam   After verbal consent patient was treated with HVLA (high velocity low amplitude), ME (muscle energy), FPR (flex positional release), ST (soft tissue), PC/PD (Pelvic Compression/ Pelvic Decompression) techniques in thoracic, lumbar, and pelvic areas. Patient tolerated the procedure well with improvement in symptoms.  Patient educated on potential side effects of soreness and recommended to rest, hydrate, and use Tylenol as needed for pain control.   Pertinent previous records reviewed include none   Follow Up: As needed if no improvement or worsening of symptoms   Subjective:    I, Moenique Parris, am serving as a Education administrator for Doctor Glennon Mac  Chief Complaint: left knee and rib pain   HPI:    03/09/21 Patient is a 55 year old female presenting with left knee pain and rib pain that has been going on for about 6 months. Patient states knee pain started with an injury he has in May 2022, had an MRI that did not show  anything, but patient has had a previous ACL repair. Patient states the pain is more of a discomfort and feels like she does not have full mobility. Patient states the she has been having left lower anterior rib discomfort with no MOI. Patient was seen by her PCP on 03/04/21 for this reason and was referred to sports medicine for evaluation and treatment. ROM in knee is decreased. Just started working out again. Rib discomfort when talking a deep breath. Right hip pain as well on and off for several years.    03/24/2021 Patient states knee is doing better after 5 weeks off back with personal trainer has weird rib protrusion and still having some pain in the upper trap getting a bone density scan today   Relevant Historical Information: L ACL repair  Additional pertinent review of systems negative.  Current Outpatient Medications  Medication Sig Dispense Refill   Bacillus Coagulans-Inulin (PROBIOTIC-PREBIOTIC PO) Take by mouth.     buPROPion (WELLBUTRIN XL) 150 MG 24 hr tablet Take 150 mg by mouth daily.     cetirizine (ZYRTEC) 10 MG tablet Take 10 mg by mouth daily.     cholecalciferol (VITAMIN D) 1000 units tablet Take 1,000 Units by mouth daily. Sublingual drops     Cyanocobalamin (B-12) 3000 MCG SUBL Place 3,000 mcg under the tongue every Monday. Sublingual Liquid Dropper     Estradiol (VAGIFEM) 10 MCG TABS vaginal tablet Place 1 tablet (10 mcg total) vaginally 2 (two) times a week. 8 tablet 11   Magnesium Oxide (MAG-200 PO) Take 200 mg by mouth every evening.     OVER THE  COUNTER MEDICATION VisionMD softgels     Riboflavin (B2) 100 MG TABS Take 200 mg by mouth every evening.     rizatriptan (MAXALT) 10 MG tablet Take 1 tablet (10 mg total) by mouth as needed for migraine. May repeat in 2 hours if needed 10 tablet 11   TURMERIC-GINGER PO Take by mouth.     Vitamin D-Vitamin K (K2 PLUS D3 PO) Take 1 Dose by mouth every Monday, Wednesday, and Friday. Vitamin d3 (1000 units)-Vitamin K2 (25  mcg) per dropperful Take 1 dropperful on Mondays, Wednesdays, & Fridays.     No current facility-administered medications for this visit.      Objective:     Vitals:   03/24/21 0958  BP: 116/60  Pulse: 77  SpO2: 98%  Weight: 120 lb (54.4 kg)  Height: 5\' 5"  (1.651 m)      Body mass index is 19.97 kg/m.    Physical Exam:     General: Well-appearing, cooperative, sitting comfortably in no acute distress.   OMT Physical Exam:  ASIS Compression Test: Positive Right Thoracic: TTP paraspinal, T4-8 RLSR Lumbar: TTP paraspinal, L1-3 RRSL Pelvis: Right anterior innominate  Electronically signed by:  Benito Mccreedy D.[facility] 10:39 AM 03/24/21

## 2021-03-24 ENCOUNTER — Other Ambulatory Visit: Payer: Self-pay

## 2021-03-24 ENCOUNTER — Ambulatory Visit (INDEPENDENT_AMBULATORY_CARE_PROVIDER_SITE_OTHER): Payer: BC Managed Care – PPO | Admitting: Sports Medicine

## 2021-03-24 VITALS — BP 116/60 | HR 77 | Ht 65.0 in | Wt 120.0 lb

## 2021-03-24 DIAGNOSIS — M9905 Segmental and somatic dysfunction of pelvic region: Secondary | ICD-10-CM

## 2021-03-24 DIAGNOSIS — M9903 Segmental and somatic dysfunction of lumbar region: Secondary | ICD-10-CM

## 2021-03-24 DIAGNOSIS — G8929 Other chronic pain: Secondary | ICD-10-CM

## 2021-03-24 DIAGNOSIS — E2839 Other primary ovarian failure: Secondary | ICD-10-CM | POA: Diagnosis not present

## 2021-03-24 DIAGNOSIS — M940 Chondrocostal junction syndrome [Tietze]: Secondary | ICD-10-CM

## 2021-03-24 DIAGNOSIS — M25562 Pain in left knee: Secondary | ICD-10-CM | POA: Diagnosis not present

## 2021-03-24 DIAGNOSIS — Z780 Asymptomatic menopausal state: Secondary | ICD-10-CM | POA: Diagnosis not present

## 2021-03-24 DIAGNOSIS — Z9889 Other specified postprocedural states: Secondary | ICD-10-CM | POA: Diagnosis not present

## 2021-03-24 DIAGNOSIS — M9902 Segmental and somatic dysfunction of thoracic region: Secondary | ICD-10-CM

## 2021-03-24 LAB — HM DEXA SCAN

## 2021-03-24 NOTE — Patient Instructions (Addendum)
Good to see you  Follow up as needed  

## 2021-03-25 ENCOUNTER — Telehealth: Payer: Self-pay

## 2021-03-25 ENCOUNTER — Telehealth: Payer: Self-pay | Admitting: Family Medicine

## 2021-03-25 DIAGNOSIS — M8589 Other specified disorders of bone density and structure, multiple sites: Secondary | ICD-10-CM | POA: Insufficient documentation

## 2021-03-25 NOTE — Telephone Encounter (Signed)
Dexa scan received and placed on PCP desk.

## 2021-03-25 NOTE — Telephone Encounter (Signed)
Please inform patient the following information: Her bone density resulted with a mild decrease in her bone density- in the osteopenia range with T-score of -1.2-1.5. This is mild.  We will repeat her density in 3 years Basic recs for Osteopenia: 1.) Drink alcohol in moderation only:  2.) Decrease caffeine consumption to no  More than 2.5 cups of coffee a day. 3.) Exercise: weight bearing (walking counts), strength and balance training. 4.) No smoking.  5.) Sunlight/Ultraviolet light exposure 30 minutes a day/5 days a week. 6. Maintain vit d levels by supplement> her levels have been good> continue current dose.  7.) calcium in the diet and by supplement 1200 mg  total daily.

## 2021-03-25 NOTE — Telephone Encounter (Signed)
Spoke with patient regarding results/recommendations.  

## 2021-03-25 NOTE — Telephone Encounter (Signed)
McKissick, Alferd Apa  Team Kune[facility] 2 hours ago (12:10 PM)   YM Pt received her results from her bone density and is concerned about some of the results. Please call her and advise.

## 2021-03-25 NOTE — Telephone Encounter (Signed)
See other encounter.

## 2021-03-29 DIAGNOSIS — F4323 Adjustment disorder with mixed anxiety and depressed mood: Secondary | ICD-10-CM | POA: Diagnosis not present

## 2021-04-05 DIAGNOSIS — F330 Major depressive disorder, recurrent, mild: Secondary | ICD-10-CM | POA: Diagnosis not present

## 2021-04-21 DIAGNOSIS — F4323 Adjustment disorder with mixed anxiety and depressed mood: Secondary | ICD-10-CM | POA: Diagnosis not present

## 2021-04-26 DIAGNOSIS — F4323 Adjustment disorder with mixed anxiety and depressed mood: Secondary | ICD-10-CM | POA: Diagnosis not present

## 2021-05-10 DIAGNOSIS — F4323 Adjustment disorder with mixed anxiety and depressed mood: Secondary | ICD-10-CM | POA: Diagnosis not present

## 2021-05-17 DIAGNOSIS — F4323 Adjustment disorder with mixed anxiety and depressed mood: Secondary | ICD-10-CM | POA: Diagnosis not present

## 2021-05-24 DIAGNOSIS — F4323 Adjustment disorder with mixed anxiety and depressed mood: Secondary | ICD-10-CM | POA: Diagnosis not present

## 2021-05-31 DIAGNOSIS — F4323 Adjustment disorder with mixed anxiety and depressed mood: Secondary | ICD-10-CM | POA: Diagnosis not present

## 2021-06-02 DIAGNOSIS — F330 Major depressive disorder, recurrent, mild: Secondary | ICD-10-CM | POA: Diagnosis not present

## 2021-06-06 DIAGNOSIS — F4323 Adjustment disorder with mixed anxiety and depressed mood: Secondary | ICD-10-CM | POA: Diagnosis not present

## 2021-06-07 ENCOUNTER — Other Ambulatory Visit: Payer: Self-pay

## 2021-06-08 ENCOUNTER — Encounter: Payer: BC Managed Care – PPO | Admitting: Family Medicine

## 2021-06-13 DIAGNOSIS — Z681 Body mass index (BMI) 19 or less, adult: Secondary | ICD-10-CM | POA: Diagnosis not present

## 2021-06-13 DIAGNOSIS — F4323 Adjustment disorder with mixed anxiety and depressed mood: Secondary | ICD-10-CM | POA: Diagnosis not present

## 2021-06-13 DIAGNOSIS — Z1231 Encounter for screening mammogram for malignant neoplasm of breast: Secondary | ICD-10-CM | POA: Diagnosis not present

## 2021-06-13 DIAGNOSIS — Z01419 Encounter for gynecological examination (general) (routine) without abnormal findings: Secondary | ICD-10-CM | POA: Diagnosis not present

## 2021-06-13 LAB — HM MAMMOGRAPHY

## 2021-06-17 IMAGING — CT CT RENAL STONE PROTOCOL
2 of 4 series · 16 of 46 positions shown, 18 images · non-contrast
Comparison: 08/03/2017 abdominal CT

CLINICAL DATA: Flank pain with kidney stone suspected

EXAM:
CT ABDOMEN AND PELVIS WITHOUT CONTRAST
TECHNIQUE: Multidetector CT imaging of the abdomen and pelvis was performed
following the standard protocol without IV contrast.

[Series 3: axial st · axial · 0.68mm/px · z∈[+1008,+1378]mm · 13 of 84 slices shown, 15 images]
[im 5/84  soft-tissue]
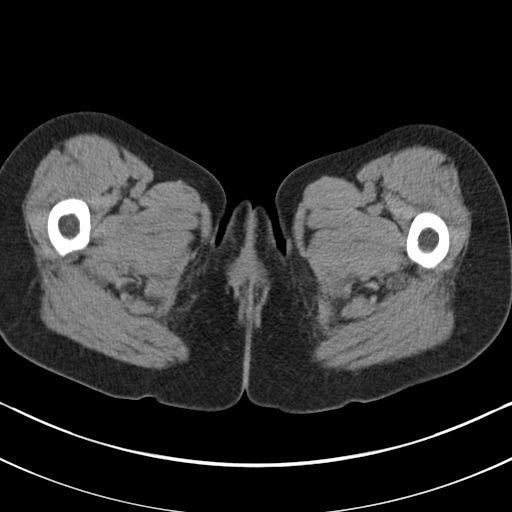
[im 5/84  bone]
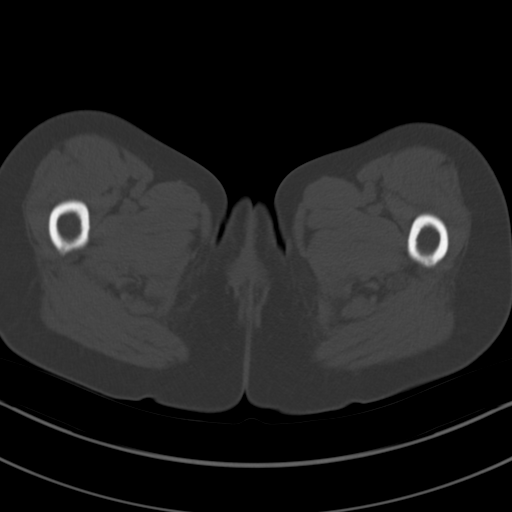
[im 13/84  soft-tissue]
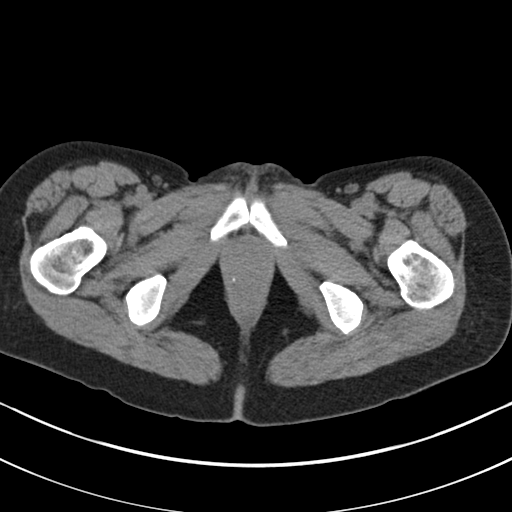
[im 17/84  soft-tissue]
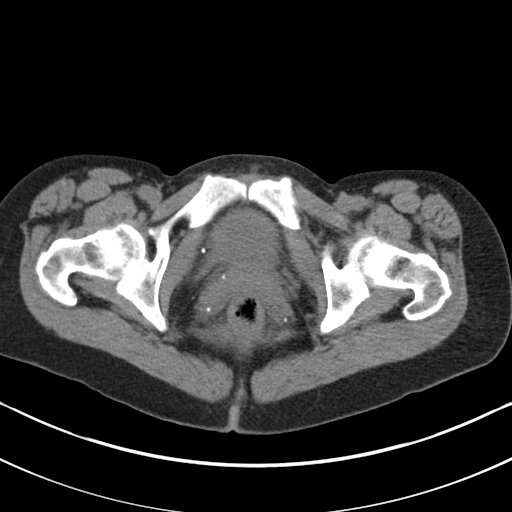
[im 25/84  soft-tissue]
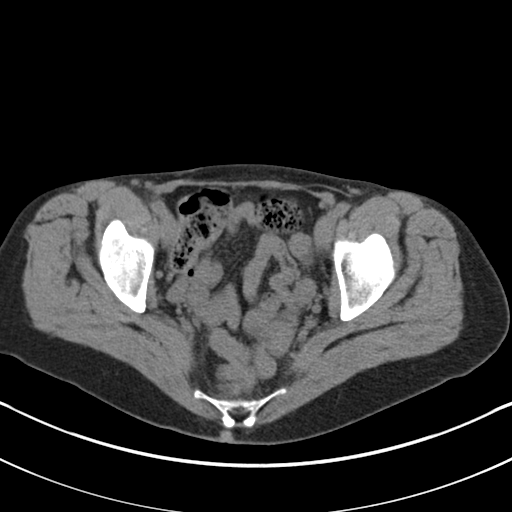
[im 30/84  soft-tissue]
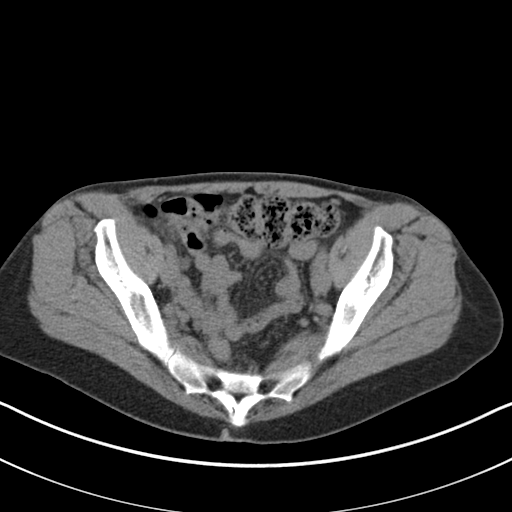
[im 38/84  soft-tissue]
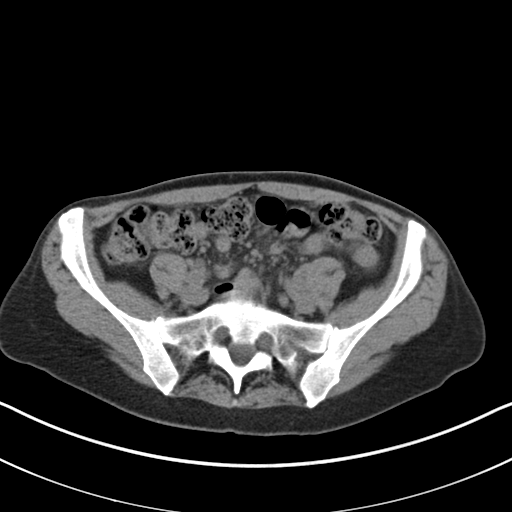
[im 42/84  soft-tissue]
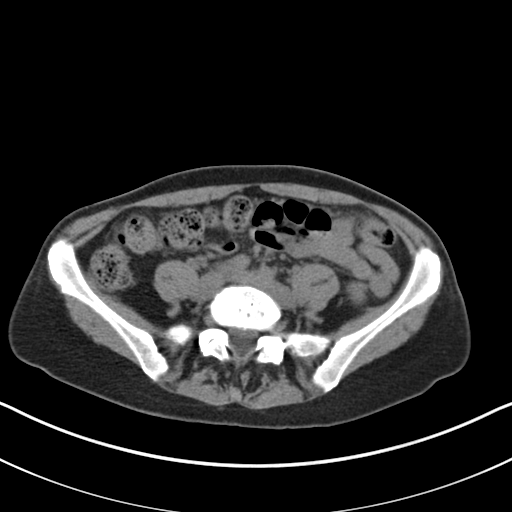
[im 46/84  soft-tissue]
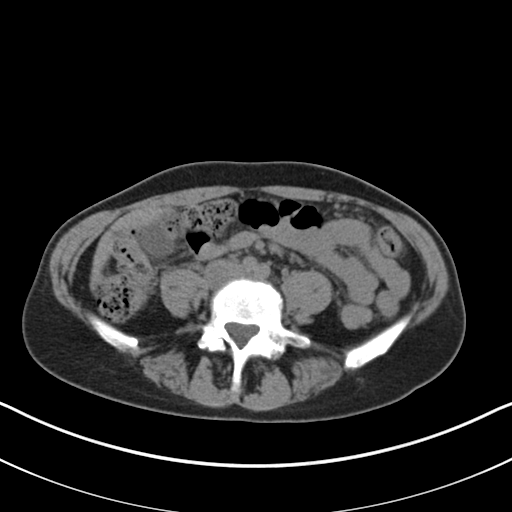
[im 54/84  soft-tissue]
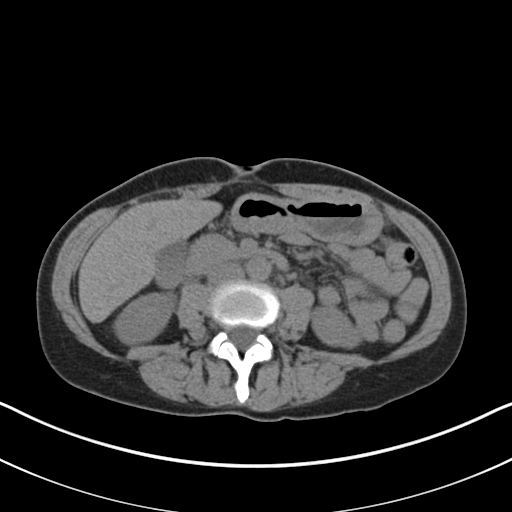
[im 54/84  bone]
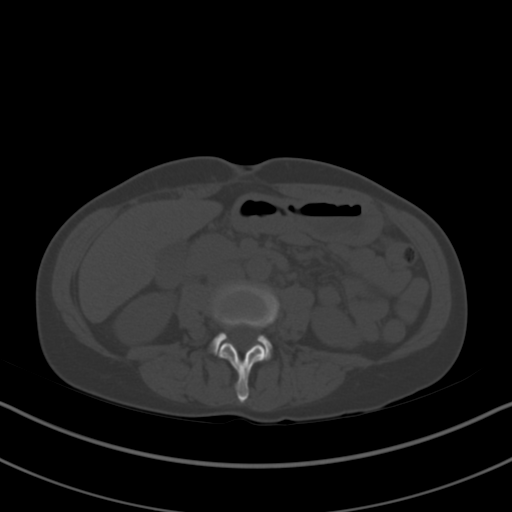
[im 59/84  soft-tissue]
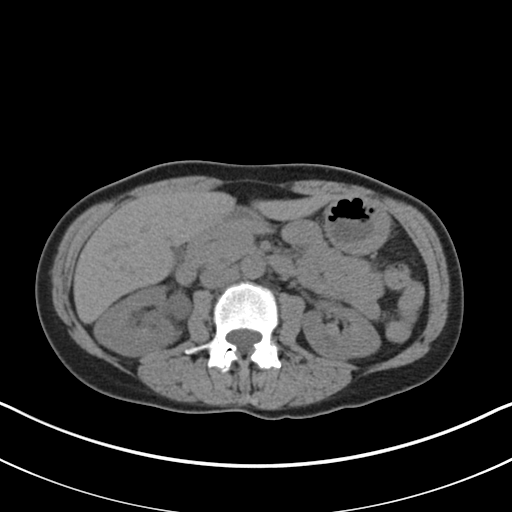
[im 67/84  soft-tissue]
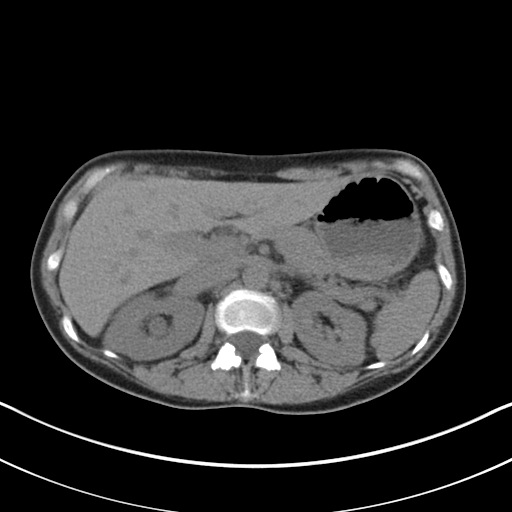
[im 71/84  soft-tissue]
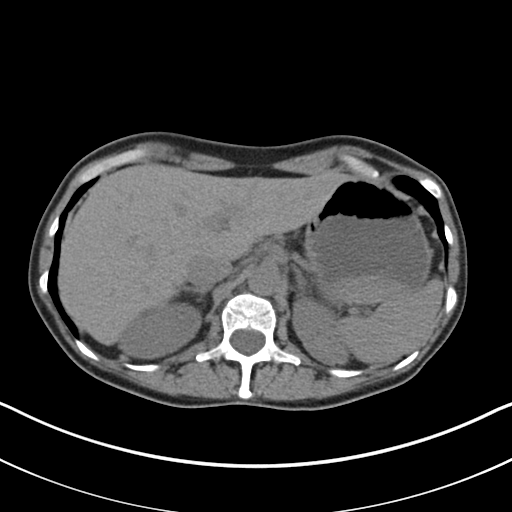
[im 79/84  soft-tissue]
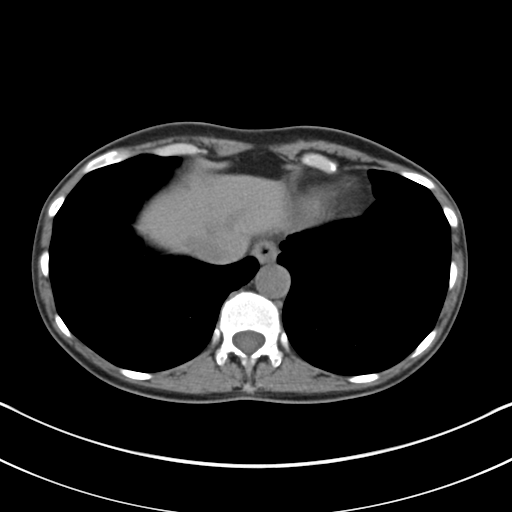

[Series 5: coronal · coronal · 0.65mm/px · 3 of 106 slices shown]
[im 36/106  soft-tissue]
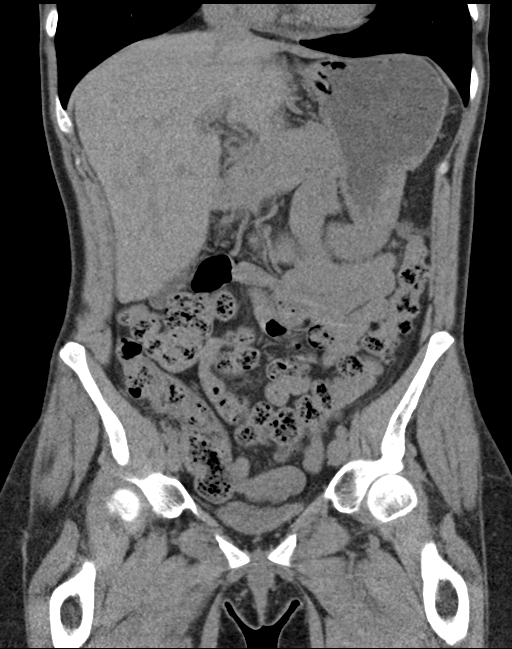
[im 47/106  soft-tissue]
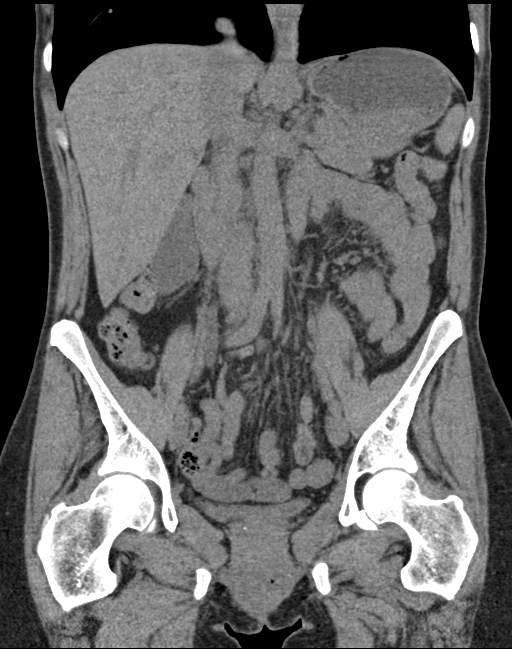
[im 59/106  soft-tissue]
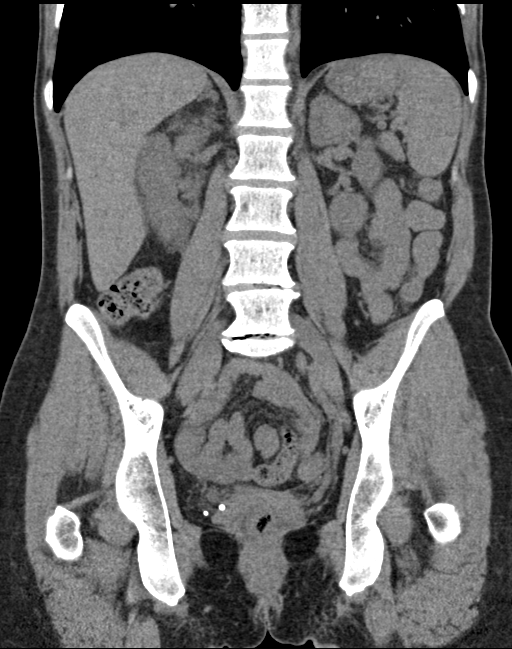

[16 of 46 positions shown; findings below may reference images not displayed]

FINDINGS: Lower chest:  No contributory findings.

Hepatobiliary: Liver lesions underestimated relative to prior
enhanced CT and MRI. No evident progressive lesion or hemorrhage.No
evidence of biliary obstruction or stone.

Pancreas: Unremarkable.

Spleen: Unremarkable.

Adrenals/Urinary Tract: Negative adrenals. Right
hydroureteronephrosis due to a 4 x 2 mm UVJ calculus. Two or 3 left
lower pole calculi measuring up to 5 mm. Cystic density in the lower
pole left kidney. Collapsed urinary bladder.

Stomach/Bowel:  No obstruction. No appendicitis.

Vascular/Lymphatic: No acute vascular abnormality. No mass or
adenopathy.

Reproductive:Hysterectomy.

Other: No ascites or pneumoperitoneum. Periumbilical hernia repair
using mesh

Musculoskeletal: No acute abnormalities.
IMPRESSION: 1. 4 x 2 mm right UVJ calculus with hydroureteronephrosis.
2. Left renal calculi measuring up to 5 mm.

## 2021-06-21 DIAGNOSIS — F4323 Adjustment disorder with mixed anxiety and depressed mood: Secondary | ICD-10-CM | POA: Diagnosis not present

## 2021-06-28 DIAGNOSIS — F4323 Adjustment disorder with mixed anxiety and depressed mood: Secondary | ICD-10-CM | POA: Diagnosis not present

## 2021-07-04 ENCOUNTER — Encounter: Payer: BC Managed Care – PPO | Admitting: Family Medicine

## 2021-07-05 DIAGNOSIS — F4323 Adjustment disorder with mixed anxiety and depressed mood: Secondary | ICD-10-CM | POA: Diagnosis not present

## 2021-07-08 ENCOUNTER — Encounter: Payer: BC Managed Care – PPO | Admitting: Family Medicine

## 2021-07-26 DIAGNOSIS — F4323 Adjustment disorder with mixed anxiety and depressed mood: Secondary | ICD-10-CM | POA: Diagnosis not present

## 2021-07-27 DIAGNOSIS — F330 Major depressive disorder, recurrent, mild: Secondary | ICD-10-CM | POA: Diagnosis not present

## 2021-08-02 DIAGNOSIS — F4323 Adjustment disorder with mixed anxiety and depressed mood: Secondary | ICD-10-CM | POA: Diagnosis not present

## 2021-08-04 ENCOUNTER — Ambulatory Visit (INDEPENDENT_AMBULATORY_CARE_PROVIDER_SITE_OTHER): Payer: BC Managed Care – PPO | Admitting: Family Medicine

## 2021-08-04 ENCOUNTER — Encounter: Payer: Self-pay | Admitting: Family Medicine

## 2021-08-04 VITALS — BP 106/71 | HR 75 | Temp 98.3°F | Ht 65.5 in | Wt 121.0 lb

## 2021-08-04 DIAGNOSIS — Z0000 Encounter for general adult medical examination without abnormal findings: Secondary | ICD-10-CM | POA: Diagnosis not present

## 2021-08-04 DIAGNOSIS — Z79890 Hormone replacement therapy (postmenopausal): Secondary | ICD-10-CM | POA: Diagnosis not present

## 2021-08-04 DIAGNOSIS — N952 Postmenopausal atrophic vaginitis: Secondary | ICD-10-CM | POA: Diagnosis not present

## 2021-08-04 DIAGNOSIS — E538 Deficiency of other specified B group vitamins: Secondary | ICD-10-CM | POA: Diagnosis not present

## 2021-08-04 DIAGNOSIS — Z1159 Encounter for screening for other viral diseases: Secondary | ICD-10-CM | POA: Diagnosis not present

## 2021-08-04 DIAGNOSIS — E2839 Other primary ovarian failure: Secondary | ICD-10-CM

## 2021-08-04 DIAGNOSIS — M8589 Other specified disorders of bone density and structure, multiple sites: Secondary | ICD-10-CM | POA: Diagnosis not present

## 2021-08-04 DIAGNOSIS — G43009 Migraine without aura, not intractable, without status migrainosus: Secondary | ICD-10-CM | POA: Diagnosis not present

## 2021-08-04 LAB — COMPREHENSIVE METABOLIC PANEL
ALT: 10 U/L (ref 0–35)
AST: 15 U/L (ref 0–37)
Albumin: 4.6 g/dL (ref 3.5–5.2)
Alkaline Phosphatase: 50 U/L (ref 39–117)
BUN: 11 mg/dL (ref 6–23)
CO2: 29 mEq/L (ref 19–32)
Calcium: 9.6 mg/dL (ref 8.4–10.5)
Chloride: 101 mEq/L (ref 96–112)
Creatinine, Ser: 0.7 mg/dL (ref 0.40–1.20)
GFR: 97.3 mL/min (ref >60.00)
Glucose, Bld: 81 mg/dL (ref 70–99)
Potassium: 4.1 mEq/L (ref 3.5–5.1)
Sodium: 137 mEq/L (ref 135–145)
Total Bilirubin: 0.6 mg/dL (ref 0.2–1.2)
Total Protein: 6.5 g/dL (ref 6.0–8.3)

## 2021-08-04 LAB — LIPID PANEL
Cholesterol: 191 mg/dL (ref 0–200)
HDL: 55.5 mg/dL (ref >39.00)
LDL Cholesterol: 117 mg/dL — ABNORMAL HIGH (ref 0–99)
NonHDL: 135.46
Total CHOL/HDL Ratio: 3
Triglycerides: 91 mg/dL (ref 0.0–149.0)
VLDL: 18.2 mg/dL (ref 0.0–40.0)

## 2021-08-04 LAB — CBC
HCT: 39.5 % (ref 36.0–46.0)
Hemoglobin: 13.1 g/dL (ref 12.0–15.0)
MCHC: 33.1 g/dL (ref 30.0–36.0)
MCV: 92.2 fl (ref 78.0–100.0)
Platelets: 233 10*3/uL (ref 150.0–400.0)
RBC: 4.29 Mil/uL (ref 3.87–5.11)
RDW: 12.5 % (ref 11.5–15.5)
WBC: 3.6 10*3/uL — ABNORMAL LOW (ref 4.0–10.5)

## 2021-08-04 LAB — VITAMIN D 25 HYDROXY (VIT D DEFICIENCY, FRACTURES): VITD: 29.76 ng/mL — ABNORMAL LOW (ref 30.00–100.00)

## 2021-08-04 LAB — TSH: TSH: 3.45 u[IU]/mL (ref 0.35–5.50)

## 2021-08-04 LAB — HEMOGLOBIN A1C: Hgb A1c MFr Bld: 5.3 % (ref 4.6–6.5)

## 2021-08-04 LAB — VITAMIN B12: Vitamin B-12: 267 pg/mL (ref 211–911)

## 2021-08-04 MED ORDER — RIZATRIPTAN BENZOATE 10 MG PO TABS: 10.0000 mg | ORAL_TABLET | ORAL | 11 refills | Status: DC | PRN

## 2021-08-04 MED ORDER — ESTRADIOL 10 MCG VA TABS: 1.0000 | ORAL_TABLET | VAGINAL | 11 refills | Status: DC

## 2021-08-04 NOTE — Progress Notes (Signed)
? ?This visit occurred during the SARS-CoV-2 public health emergency.  Safety protocols were in place, including screening questions prior to the visit, additional usage of staff PPE, and extensive cleaning of exam room while observing appropriate contact time as indicated for disinfecting solutions.  ? ? ?Patient ID: Sara Davidson, female  DOB: [birth year], 56 y.o.   MRN: [id number] ?Patient Care Team  ?  Relationship Specialty Notifications Start End  ?Ma Hillock, DO PCP - General Family Medicine  03/04/21   ?Norma Fredrickson, MD Consulting Physician Psychiatry  03/04/21   ?Vickey Huger, MD Consulting Physician Orthopedic Surgery  03/04/21   ?Rodena Goldmann, [facility]  Behavioral Health  03/04/21   ?Vania Rea, MD Consulting Physician Obstetrics and Gynecology  03/04/21   ?Wilford Corner, MD Consulting Physician Gastroenterology  03/04/21   ?Marygrace Drought, MD Consulting Physician Ophthalmology  03/04/21   ? ? ?Chief Complaint  ?Patient presents with  ? Annual Exam  ?  Pt is fasting  ? ? ?Subjective: ?Sara Davidson is a 56 y.o.  Female  present for CPE/cmc ?All past medical history, surgical history, allergies, family history, immunizations, medications and social history were updated in the electronic medical record today. ?All recent labs, ED visits and hospitalizations within the last year were reviewed. ? ?Health maintenance:  ?Colonoscopy: completed 05/25/2016, by Dr. Bland Span up 10 yrs.  ?Mammogram: completed:04/12/2020,  at [facility] ?Cervical cancer screening: Hysterectomy ?Immunizations: tdap UTD 2022, Influenza UTD 2022 (encouraged yearly), shingrix completed, covid completed ?Infectious disease screening: HIV completed, Hep C completed today ?DEXA: last completed 03/24/2021, result osteopenia, follow needed 2-3 yrs Jule Ser image) ?Assistive device: none ?Oxygen OXB:DZHG ?Patient has a Dental home. ?Hospitalizations/ED visits: reviewed ? ?Migraine: ?Patient reports she uses Maxalt  for her migraines, and it works rather well for her She states her triggers typically are barometric pressure and different scents that are strong. ?  ?Postmenopausal syndrome: ?She reports hysterectomy secondary to fibroids and menorrhagia.  Her ovaries are intact.  She reports her gynecologist states she is now menopausal secondary to her hormone levels.  She has been prescribed Vagifem tablets twice weekly to help with her atrophic vaginitis secondary to menopause.  She feels this works well for her.  Continues to follow with gynecology as indicated for pelvic exams.  ? ? ?  03/04/2021  ?  2:33 PM 12/21/2017  ? 11:09 AM 12/12/2017  ? 10:18 AM 05/21/2017  ? 11:28 AM 08/30/2014  ? 10:27 AM  ?Depression screen PHQ 2/9  ?Decreased Interest 0 0 0 0 0  ?Down, Depressed, Hopeless 0 0 0 0 0  ?PHQ - 2 Score 0 0 0 0 0  ? ?   ? View : No data to display.  ?  ?  ?  ? ? ?Immunization History  ?Administered Date(s) Administered  ? Influenza-Unspecified 01/15/2021  ? PFIZER(Purple Top)SARS-COV-2 Vaccination 05/31/2019, 06/24/2019, 08/03/2020  ? Pension scheme manager 44yr & up 01/20/2021  ? Td 08/26/2020  ? Zoster Recombinat (Shingrix) 08/26/2020, 03/04/2021  ? ? ?Past Medical History:  ?Diagnosis Date  ? Anemia   ? Anxiety   ? Chicken pox   ? CIN I (cervical intraepithelial neoplasia I)   ? Contact lens/glasses fitting   ? wears contacts or glasses  ? COVID-19 01/2021  ? Cyclical mastalgia   ? Depression   ? Headache   ? migraines  ? Papanicolaou smear of vagina with high grade squamous intraepithelial lesion (HGSIL)   ? ?Allergies  ?Allergen Reactions  ? Sulfa Antibiotics  Hives  ? ?Past Surgical History:  ?Procedure Laterality Date  ? BREAST BIOPSY Right 07/04/2012  ? Procedure: Right Breast Wire Localized Excision;  Surgeon: Rolm Bookbinder, MD;  Location: [facility];  Service: General;  Laterality: Right;  ? CERVICAL CONIZATION W/BX  2004  ? FOOT BONE EXCISION Right 03/2013  ? RIGHT FOOT 5TH  METATARSAL SURGERY WITH PIN  ? INGUINAL HERNIA REPAIR Right 2015  ? WITH MESH  ? KNEE ARTHROSCOPY W/ ACL RECONSTRUCTION  [id number]  ? Left  ? LAPAROSCOPIC VAGINAL HYSTERECTOMY WITH SALPINGECTOMY Bilateral 09/26/2017  ? Procedure: LAPAROSCOPIC ASSISTED VAGINAL HYSTERECTOMY WITH SALPINGECTOMY;  Surgeon: Jerelyn Charles, MD;  Location: [facility];  Service: Gynecology;  Laterality: Bilateral;  DR Carlis Abbott REQUESTING 3 HOURS OF OR TIME  ? LIPOMA EXCISION  2008  ? lt forearm  ? METATARSAL OSTEOTOMY    ? SHOULDER ARTHROSCOPY Left 01/2013  ? SHOULDER ARTHROSCOPY    ? UMBILICAL HERNIA REPAIR  2011  ? [facility]  ? ?Family History  ?Problem Relation Age of Onset  ? Stroke Mother   ? Hyperlipidemia Mother   ? Arthritis Mother   ? Breast cancer Mother   ? Depression Father   ? Learning disabilities Father   ? Hearing loss Father   ? Early death Father   ? Bipolar disorder Father   ? Suicidality Father   ? Learning disabilities Daughter   ? Depression Daughter   ? Asthma Daughter   ? Depression Daughter   ? Arthritis Maternal Grandmother   ? Heart attack Maternal Grandfather   ? Arthritis Paternal Grandmother   ? Heart attack Paternal Grandfather   ? ?Social History  ? ?Social History Narrative  ? Marital status/children/pets: Married.  2 grown children.  ? Education/employment: Bachelor's of arts-anthropology.  She is an Training and development officer.  ? Safety:   ?   -Wears a bicycle helmet riding a bike: Yes  ?   -smoke alarm in the home:Yes  ?   - wears seatbelt: Yes  ?   - Feels safe in their relationships: Yes  ?   ? ? ?Allergies as of 08/04/2021   ? ?   Reactions  ? Sulfa Antibiotics Hives  ? ?  ? ?  ?Medication List  ?  ? ?  ? Accurate as of August 04, 2021 10:14 AM. If you have any questions, ask your nurse or doctor.  ?  ?  ? ?  ? ?STOP taking these medications   ? ?buPROPion 150 MG 24 hr tablet ?Commonly known as: WELLBUTRIN XL ?Stopped by: Howard Pouch, DO ?  ?buPROPion 300 MG 24 hr tablet ?Commonly known as: WELLBUTRIN XL ?Stopped by: Howard Pouch, DO ?   ? ?  ? ?TAKE these medications   ? ?B-12 3000 MCG Subl ?Place 3,000 mcg under the tongue every Monday. Sublingual Liquid Dropper ?  ?B2 100 MG Tabs ?Take 200 mg by mouth every evening. ?  ?CALCIUM 1200 PO ?Take by mouth. ?  ?cetirizine 10 MG tablet ?Commonly known as: ZYRTEC ?Take 10 mg by mouth daily. ?  ?cholecalciferol 1000 units tablet ?Commonly known as: VITAMIN D ?Take 1,000 Units by mouth daily. Sublingual drops ?  ?COLLAGEN PO ?Take by mouth. ?  ?Estradiol 10 MCG Tabs vaginal tablet ?Commonly known as: Vagifem ?Place 1 tablet (10 mcg total) vaginally 2 (two) times a week. ?  ?K2 PLUS D3 PO ?Take 1 Dose by mouth every Monday, Wednesday, and Friday. Vitamin d3 (1000 units)-Vitamin K2 (25 mcg) per dropperful ?Take  1 dropperful on Mondays, Wednesdays, & Fridays. ?  ?MAG-200 PO ?Take 200 mg by mouth every evening. ?  ?OVER THE COUNTER MEDICATION ?VisionMD softgels ?  ?PROBIOTIC-PREBIOTIC PO ?Take by mouth. ?  ?rizatriptan 10 MG tablet ?Commonly known as: MAXALT ?Take 1 tablet (10 mg total) by mouth as needed for migraine. May repeat in 2 hours if needed ?  ?TURMERIC-GINGER PO ?Take by mouth. ?  ?venlafaxine XR 75 MG 24 hr capsule ?Commonly known as: EFFEXOR-XR ?Take 75 mg by mouth daily. ?  ?VISION PLUS PO ?Take by mouth. ?  ? ?  ? ? ?All past medical history, surgical history, allergies, family history, immunizations andmedications were updated in the EMR today and reviewed under the history and medication portions of their EMR.    ? ?No results found for this or any previous visit (from the past 2160 hour(s)). ? ? ?ROS ?14 pt review of systems performed and negative (unless mentioned in an HPI) ? ?Objective: ?BP 106/71   Pulse 75   Temp 98.3 ?F (36.8 ?C) (Oral)   Ht 5' 5.5" (1.664 m)   Wt 121 lb (54.9 kg)   LMP 09/24/2017 (Exact Date)   SpO2 100%   BMI 19.83 kg/m?  ?Physical Exam ?Vitals and nursing note reviewed.  ?Constitutional:   ?   General: She is not in acute distress. ?   Appearance: Normal  appearance. She is not ill-appearing or toxic-appearing.  ?HENT:  ?   Head: Normocephalic and atraumatic.  ?   Right Ear: Tympanic membrane, ear canal and external ear normal. There is no impacted cerumen.  ?   Left

## 2021-08-04 NOTE — Patient Instructions (Addendum)
Return in about 1 year (around 08/06/2022) for cpe (20 min). ? ?Great to see you today.  ?I have refilled the medication(s) we provide.  ? ?If labs were collected, we will inform you of lab results once received either by echart message or telephone call.  ? - echart message- for normal results that have been seen by the patient already.  ? - telephone call: abnormal results or if patient has not viewed results in their echart. ? ? ? ?Health Maintenance, Female ?Adopting a healthy lifestyle and getting preventive care are important in promoting health and wellness. Ask your health care provider about: ?The right schedule for you to have regular tests and exams. ?Things you can do on your own to prevent diseases and keep yourself healthy. ?What should I know about diet, weight, and exercise? ?Eat a healthy diet ? ?Eat a diet that includes plenty of vegetables, fruits, low-fat dairy products, and lean protein. ?Do not eat a lot of foods that are high in solid fats, added sugars, or sodium. ?Maintain a healthy weight ?Body mass index (BMI) is used to identify weight problems. It estimates body fat based on height and weight. Your health care provider can help determine your BMI and help you achieve or maintain a healthy weight. ?Get regular exercise ?Get regular exercise. This is one of the most important things you can do for your health. Most adults should: ?Exercise for at least 150 minutes each week. The exercise should increase your heart rate and make you sweat (moderate-intensity exercise). ?Do strengthening exercises at least twice a week. This is in addition to the moderate-intensity exercise. ?Spend less time sitting. Even light physical activity can be beneficial. ?Watch cholesterol and blood lipids ?Have your blood tested for lipids and cholesterol at 56 years of age, then have this test every 5 years. ?Have your cholesterol levels checked more often if: ?Your lipid or cholesterol levels are high. ?You are  older than 56 years of age. ?You are at high risk for heart disease. ?What should I know about cancer screening? ?Depending on your health history and family history, you may need to have cancer screening at various ages. This may include screening for: ?Breast cancer. ?Cervical cancer. ?Colorectal cancer. ?Skin cancer. ?Lung cancer. ?What should I know about heart disease, diabetes, and high blood pressure? ?Blood pressure and heart disease ?High blood pressure causes heart disease and increases the risk of stroke. This is more likely to develop in people who have high blood pressure readings or are overweight. ?Have your blood pressure checked: ?Every 3-5 years if you are 97-59 years of age. ?Every year if you are 29 years old or older. ?Diabetes ?Have regular diabetes screenings. This checks your fasting blood sugar level. Have the screening done: ?Once every three years after age 23 if you are at a normal weight and have a low risk for diabetes. ?More often and at a younger age if you are overweight or have a high risk for diabetes. ?What should I know about preventing infection? ?Hepatitis B ?If you have a higher risk for hepatitis B, you should be screened for this virus. Talk with your health care provider to find out if you are at risk for hepatitis B infection. ?Hepatitis C ?Testing is recommended for: ?Everyone born from [birth year] through 1965. ?Anyone with known risk factors for hepatitis C. ?Sexually transmitted infections (STIs) ?Get screened for STIs, including gonorrhea and chlamydia, if: ?You are sexually active and are younger than 56 years of  age. ?You are older than 56 years of age and your health care provider tells you that you are at risk for this type of infection. ?Your sexual activity has changed since you were last screened, and you are at increased risk for chlamydia or gonorrhea. Ask your health care provider if you are at risk. ?Ask your health care provider about whether you are at high risk  for HIV. Your health care provider may recommend a prescription medicine to help prevent HIV infection. If you choose to take medicine to prevent HIV, you should first get tested for HIV. You should then be tested every 3 months for as long as you are taking the medicine. ?Pregnancy ?If you are about to stop having your period (premenopausal) and you may become pregnant, seek counseling before you get pregnant. ?Take 400 to 800 micrograms (mcg) of folic acid every day if you become pregnant. ?Ask for birth control (contraception) if you want to prevent pregnancy. ?Osteoporosis and menopause ?Osteoporosis is a disease in which the bones lose minerals and strength with aging. This can result in bone fractures. If you are 80 years old or older, or if you are at risk for osteoporosis and fractures, ask your health care provider if you should: ?Be screened for bone loss. ?Take a calcium or vitamin D supplement to lower your risk of fractures. ?Be given hormone replacement therapy (HRT) to treat symptoms of menopause. ?Follow these instructions at home: ?Alcohol use ?Do not drink alcohol if: ?Your health care provider tells you not to drink. ?You are pregnant, may be pregnant, or are planning to become pregnant. ?If you drink alcohol: ?Limit how much you have to: ?0-1 drink a day. ?Know how much alcohol is in your drink. In the [place] one drink equals one 12 oz bottle of beer (355 mL), one 5 oz glass of wine (148 mL), or one 1? oz glass of hard liquor (44 mL). ?Lifestyle ?Do not use any products that contain nicotine or tobacco. These products include cigarettes, chewing tobacco, and vaping devices, such as e-cigarettes. If you need help quitting, ask your health care provider. ?Do not use street drugs. ?Do not share needles. ?Ask your health care provider for help if you need support or information about quitting drugs. ?General instructions ?Schedule regular health, dental, and eye exams. ?Stay current with your  vaccines. ?Tell your health care provider if: ?You often feel depressed. ?You have ever been abused or do not feel safe at home. ?Summary ?Adopting a healthy lifestyle and getting preventive care are important in promoting health and wellness. ?Follow your health care provider's instructions about healthy diet, exercising, and getting tested or screened for diseases. ?Follow your health care provider's instructions on monitoring your cholesterol and blood pressure. ?This information is not intended to replace advice given to you by your health care provider. Make sure you discuss any questions you have with your health care provider. ?Document Revised: 08/23/2020 Document Reviewed: 08/23/2020 ?Elsevier Patient Education ? [place]. ? ?

## 2021-08-05 ENCOUNTER — Telehealth: Payer: Self-pay | Admitting: Family Medicine

## 2021-08-05 DIAGNOSIS — E538 Deficiency of other specified B group vitamins: Secondary | ICD-10-CM | POA: Insufficient documentation

## 2021-08-05 DIAGNOSIS — E559 Vitamin D deficiency, unspecified: Secondary | ICD-10-CM | POA: Insufficient documentation

## 2021-08-05 LAB — HEPATITIS C ANTIBODY
Hepatitis C Ab: NONREACTIVE
SIGNAL TO CUT-OFF: 0.11 (ref <1.00)

## 2021-08-05 NOTE — Telephone Encounter (Signed)
Closing encounter due to multiple encounters.  ?

## 2021-08-05 NOTE — Telephone Encounter (Signed)
Please call patient ?Liver, kidney and thyroid function are normal ?Blood cell counts and electrolytes are normal ?Diabetes screening/A1c is normal a ?Cholesterol panel looks good and is at goal for her. ?Hepatitis C screening is negative. ? ?Vitamin D is low at 29.76.  Would encourage her to increase her daily dose of vitamin D by 1000 mg daily. ?Her B12 is severely low at 267.  I would encourage her to start over-the-counter sublingual B12 1000 mcg daily.  If she would like to start B12 injections we can offer that for her here.  She would have an injection of B12 every 2 weeks for 4 doses and then once monthly. ? ?Once on the above vitamin for 12 weeks we would see her back here for provider visit and recheck levels. ? ?

## 2021-08-05 NOTE — Telephone Encounter (Signed)
Pt called about lab results released to mychart. Pt was advised provider has to review and she will be called with recommendations. Pt requesting call from Dr. Raoul Pitch directly if possible. She is very concerned. ?

## 2021-08-05 NOTE — Telephone Encounter (Signed)
Spoke with pt regarding labs and instructions.   

## 2021-08-05 NOTE — Telephone Encounter (Signed)
Pt has question regarding WBC.  ?

## 2021-08-07 ENCOUNTER — Encounter: Payer: Self-pay | Admitting: Family Medicine

## 2021-08-08 DIAGNOSIS — F4323 Adjustment disorder with mixed anxiety and depressed mood: Secondary | ICD-10-CM | POA: Diagnosis not present

## 2021-08-08 NOTE — Telephone Encounter (Signed)
WBC is normal variant at 3.6, last WBC was 3.8. Rest of cell lines are normal which is reassuring.  ?Her wbc in 2019 was the highest at 10.5> that particular wbc was much higher than her usual and she was also anemic on that date> this was the day of hysterectomy procedure and the reason wbc was higher. ? ?We will recheck her cbc in 3 mos at her follow up, after she gets her b12 and vit d levels in to normal range.  ?

## 2021-08-08 NOTE — Telephone Encounter (Signed)
Spoke with pt regarding labs and instructions.   

## 2021-08-09 NOTE — Telephone Encounter (Signed)
Please advise if pt need separate appt.  ?

## 2021-08-10 ENCOUNTER — Ambulatory Visit (INDEPENDENT_AMBULATORY_CARE_PROVIDER_SITE_OTHER): Payer: BC Managed Care – PPO

## 2021-08-10 DIAGNOSIS — E538 Deficiency of other specified B group vitamins: Secondary | ICD-10-CM | POA: Diagnosis not present

## 2021-08-10 MED ORDER — CYANOCOBALAMIN 1000 MCG/ML IJ SOLN
1000.0000 ug | INTRAMUSCULAR | Status: AC
  Administered 2021-08-10 – 2021-09-21 (×4): 1000 ug via INTRAMUSCULAR

## 2021-08-10 NOTE — Telephone Encounter (Signed)
At the least, I would recommend she wait at least 6 weeks before follow-up.  Allow the B12 and vitamin D levels to enter into normal range to see how she feels.  This will allow [place] to see if/what symptoms remain after the vitamin deficiency is corrected, and guide [place] on what we need to focus on.  ? ?3 months follow-up on the CBC is recommended because that is the natural turnover of blood cells in the body.  ? ?If she wants to pursue autoimmune testing, we certainly can.  It would be helpful to wait 6 weeks that we know what symptoms to focus in on.  Any further questions, we can discuss during her appointment. ?

## 2021-08-10 NOTE — Progress Notes (Signed)
Pt here for B12 injection per Kuneff   B12 1000mcg given IM, and pt tolerated injection well.   Next B12 injection scheduled for 2 weeks 

## 2021-08-11 NOTE — Telephone Encounter (Signed)
FYI

## 2021-08-16 DIAGNOSIS — F4323 Adjustment disorder with mixed anxiety and depressed mood: Secondary | ICD-10-CM | POA: Diagnosis not present

## 2021-08-23 DIAGNOSIS — F4323 Adjustment disorder with mixed anxiety and depressed mood: Secondary | ICD-10-CM | POA: Diagnosis not present

## 2021-08-24 ENCOUNTER — Ambulatory Visit (INDEPENDENT_AMBULATORY_CARE_PROVIDER_SITE_OTHER): Payer: BC Managed Care – PPO

## 2021-08-24 DIAGNOSIS — E538 Deficiency of other specified B group vitamins: Secondary | ICD-10-CM | POA: Diagnosis not present

## 2021-08-24 NOTE — Progress Notes (Signed)
Pt here today for biweekely B12 shot. Shot was given with no issue. Rx provided by office.   ?

## 2021-08-30 DIAGNOSIS — F4323 Adjustment disorder with mixed anxiety and depressed mood: Secondary | ICD-10-CM | POA: Diagnosis not present

## 2021-09-07 ENCOUNTER — Ambulatory Visit (INDEPENDENT_AMBULATORY_CARE_PROVIDER_SITE_OTHER): Payer: BC Managed Care – PPO

## 2021-09-07 DIAGNOSIS — E538 Deficiency of other specified B group vitamins: Secondary | ICD-10-CM

## 2021-09-07 NOTE — Progress Notes (Signed)
Pt here for B12 injection per Kuneff   B12 1000mcg given IM, and pt tolerated injection well.   Next B12 injection scheduled for 2 weeks 

## 2021-09-14 DIAGNOSIS — F4323 Adjustment disorder with mixed anxiety and depressed mood: Secondary | ICD-10-CM | POA: Diagnosis not present

## 2021-09-21 ENCOUNTER — Ambulatory Visit (INDEPENDENT_AMBULATORY_CARE_PROVIDER_SITE_OTHER): Payer: BC Managed Care – PPO

## 2021-09-21 DIAGNOSIS — E538 Deficiency of other specified B group vitamins: Secondary | ICD-10-CM | POA: Diagnosis not present

## 2021-09-21 NOTE — Progress Notes (Signed)
Pt here for B12 injection per Kuneff   B12 1000mcg given IM, and pt tolerated injection well.   Next B12 injection scheduled for 2 weeks 

## 2021-09-27 DIAGNOSIS — F4323 Adjustment disorder with mixed anxiety and depressed mood: Secondary | ICD-10-CM | POA: Diagnosis not present

## 2021-09-29 DIAGNOSIS — N760 Acute vaginitis: Secondary | ICD-10-CM | POA: Diagnosis not present

## 2021-09-30 ENCOUNTER — Telehealth: Payer: Self-pay

## 2021-09-30 NOTE — Telephone Encounter (Signed)
[facility] [facility] - Client TELEPHONE ADVICE RECORD [facility] Patient Name: Sara Davidson Gender: Female DOB: 05-Feb-1966 Age: 56 Y 46 M Return Phone Number: [phone] (Primary) Address: City/ State/ Zip: [place]  [address] Client [facility] - Client Client Site [facility] Day Provider Raoul Pitch, [place] Type Call Who Is Calling Patient / Member / Family / Caregiver Call Type Triage / Clinical Relationship To Patient Self Return Phone Number [phone] (Primary) Chief Complaint Vaginal Itching Reason for Call Symptomatic / Request for [place] states she would like to make an appointment for today. Symptoms include a possible yeast infection Translation No No Triage Reason Patient declined Nurse Assessment Nurse: Raphael Gibney, RN, Vanita Ingles Date/Time (Eastern Time): 09/29/2021 8:42:03 AM Confirm and document reason for call. If symptomatic, describe symptoms. ---Caller states she called her OB GYN and has heard back from a nurse and has appt today at 11:30 am. Does the patient have any new or worsening symptoms? ---Yes Will a triage be completed? ---No Select reason for no triage. ---Patient declined Please document clinical information provided and list any resource used. ---Pt states she has appt at 11:30 am with her OBGYN Disp. Time Eilene Ghazi Time) Disposition Final User 09/29/2021 8:45:12 AM Clinical Call Yes Raphael Gibney, RN, Vanita Ingles

## 2021-10-10 DIAGNOSIS — N951 Menopausal and female climacteric states: Secondary | ICD-10-CM | POA: Diagnosis not present

## 2021-10-10 DIAGNOSIS — Z681 Body mass index (BMI) 19 or less, adult: Secondary | ICD-10-CM | POA: Diagnosis not present

## 2021-10-11 DIAGNOSIS — F4323 Adjustment disorder with mixed anxiety and depressed mood: Secondary | ICD-10-CM | POA: Diagnosis not present

## 2021-10-20 ENCOUNTER — Ambulatory Visit: Payer: BC Managed Care – PPO

## 2021-10-21 ENCOUNTER — Ambulatory Visit: Payer: BC Managed Care – PPO

## 2021-10-24 ENCOUNTER — Ambulatory Visit (INDEPENDENT_AMBULATORY_CARE_PROVIDER_SITE_OTHER): Payer: BC Managed Care – PPO

## 2021-10-24 DIAGNOSIS — E538 Deficiency of other specified B group vitamins: Secondary | ICD-10-CM | POA: Diagnosis not present

## 2021-10-25 DIAGNOSIS — F4323 Adjustment disorder with mixed anxiety and depressed mood: Secondary | ICD-10-CM | POA: Diagnosis not present

## 2021-10-26 DIAGNOSIS — F331 Major depressive disorder, recurrent, moderate: Secondary | ICD-10-CM | POA: Diagnosis not present

## 2021-10-26 DIAGNOSIS — F340 Cyclothymic disorder: Secondary | ICD-10-CM | POA: Diagnosis not present

## 2021-11-02 ENCOUNTER — Ambulatory Visit: Payer: BC Managed Care – PPO | Admitting: Family Medicine

## 2021-11-02 ENCOUNTER — Encounter: Payer: Self-pay | Admitting: Family Medicine

## 2021-11-02 VITALS — BP 93/61 | HR 80 | Temp 98.2°F | Ht 65.0 in | Wt 122.0 lb

## 2021-11-02 DIAGNOSIS — E559 Vitamin D deficiency, unspecified: Secondary | ICD-10-CM | POA: Diagnosis not present

## 2021-11-02 DIAGNOSIS — E538 Deficiency of other specified B group vitamins: Secondary | ICD-10-CM

## 2021-11-02 LAB — VITAMIN B12: Vitamin B-12: 431 pg/mL (ref 211–911)

## 2021-11-02 LAB — VITAMIN D 25 HYDROXY (VIT D DEFICIENCY, FRACTURES): VITD: 33.51 ng/mL (ref 30.00–100.00)

## 2021-11-02 MED ORDER — CYANOCOBALAMIN 1000 MCG/ML IJ SOLN
1000.0000 ug | Freq: Once | INTRAMUSCULAR | Status: AC
  Administered 2021-10-24: 1000 ug via INTRAMUSCULAR

## 2021-11-02 NOTE — Progress Notes (Signed)
Pt here for B12 injection per [facility]   B12 1010mg given IM, and pt tolerated injection well.   Next B12 injection scheduled for 2 weeks

## 2021-11-02 NOTE — Progress Notes (Signed)
Patient ID: Sara Davidson, female  DOB: 09/15/1965, 56 y.o.   MRN: [id number] Patient Care Team    Relationship Specialty Notifications Start End  Ma Hillock, DO PCP - General Family Medicine  03/04/21   Norma Fredrickson, MD Consulting Physician Psychiatry  03/04/21   Vickey Huger, MD Consulting Physician Orthopedic Surgery  03/04/21   Rodena Goldmann, [place]  03/04/21   Vania Rea, MD Consulting Physician Obstetrics and Gynecology  03/04/21   Wilford Corner, MD Consulting Physician Gastroenterology  03/04/21   Marygrace Drought, MD Consulting Physician Ophthalmology  03/04/21     Chief Complaint  Patient presents with   B12 def    F/u; pt is not fasting    Subjective: Sara Davidson is a 56 y.o.  Female  present for cmc All past medical history, surgical history, allergies, family history, immunizations, medications and social history were updated in the electronic medical record today. All recent labs, ED visits and hospitalizations within the last year were reviewed.   Migraine: Patient reports she uses Maxalt for her migraines, and it works rather well for her She states her triggers typically are barometric pressure and different scents that are strong.   B12/vit d def: She is filling improved since supplementing.     03/04/2021    2:33 PM 12/21/2017   11:09 AM 12/12/2017   10:18 AM 05/21/2017   11:28 AM 08/30/2014   10:27 AM  Depression screen PHQ 2/9  Decreased Interest 0 0 0 0 0  Down, Depressed, Hopeless 0 0 0 0 0  PHQ - 2 Score 0 0 0 0 0       No data to display          Immunization History  Administered Date(s) Administered   Influenza,inj,Quad PF,6+ Mos 01/25/2016   Influenza-Unspecified 02/10/2014, 01/15/2021   PFIZER(Purple Top)SARS-COV-2 Vaccination 05/31/2019, 06/24/2019, 08/03/2020   Pfizer Covid-19 Vaccine Bivalent Booster 44yr & up 01/20/2021   Td 08/26/2020   Zoster Recombinat (Shingrix) 08/26/2020,  03/04/2021    Past Medical History:  Diagnosis Date   Anemia    Anxiety    Chicken pox    CIN I (cervical intraepithelial neoplasia I)    Contact lens/glasses fitting    wears contacts or glasses   [id number]  Cyclical mastalgia    Depression    Headache    migraines   Papanicolaou smear of vagina with high grade squamous intraepithelial lesion (HGSIL)    Allergies  Allergen Reactions   Sulfa Antibiotics Hives   Past Surgical History:  Procedure Laterality Date   BREAST BIOPSY Right 07/04/2012   Procedure: Right Breast Wire Localized Excision;  Surgeon: MRolm Bookbinder MD;  [facility]  Service: General;  Laterality: Right;   CERVICAL CONIZATION W/BX  2004   FOOT BONE EXCISION Right 03/2013   RIGHT FOOT 5TH METATARSAL SURGERY WITH PIN   INGUINAL HERNIA REPAIR Right 2015   WITH MESH   KNEE ARTHROSCOPY W/ ACL RECONSTRUCTION  17106,2694  Left   LAPAROSCOPIC VAGINAL HYSTERECTOMY WITH SALPINGECTOMY Bilateral 09/26/2017   Procedure: LAPAROSCOPIC ASSISTED VAGINAL HYSTERECTOMY WITH SALPINGECTOMY;  Surgeon: CJerelyn Charles MD;  [facility];  Service: Gynecology;  Laterality: Bilateral;  DR CCarlis AbbottREQUESTING 3 HOURS OF OR TIME   LIPOMA EXCISION  2008   lt forearm   METATARSAL OSTEOTOMY     SHOULDER ARTHROSCOPY Left 01/2013   SHOULDER ARTHROSCOPY     UMBILICAL HERNIA REPAIR  2011  UMB   Family History  Problem Relation Age of Onset   Stroke Mother    Hyperlipidemia Mother    Arthritis Mother    Breast cancer Mother    Depression Father    Learning disabilities Father    Hearing loss Father    Early death Father    Bipolar disorder Father    Suicidality Father    Learning disabilities Daughter    Depression Daughter    Asthma Daughter    Depression Daughter    Arthritis Maternal Grandmother    Heart attack Maternal Grandfather    Arthritis Paternal Grandmother    Heart attack Paternal Grandfather    Social History   Social  History Narrative   Marital status/children/pets: Married.  2 grown children.   Education/employment: Bachelor's of arts-anthropology.  She is an Training and development officer.   Engineer, materials:      -Wears a bicycle helmet riding a bike: Yes     -smoke alarm in the home:Yes     - wears seatbelt: Yes     - Feels safe in their relationships: Yes       Allergies as of 11/02/2021       Reactions   Sulfa Antibiotics Hives        Medication List        Accurate as of November 02, 2021 10:41 AM. If you have any questions, ask your nurse or doctor.          STOP taking these medications    B-12 3000 MCG Subl Stopped by: Howard Pouch, DO   venlafaxine XR 75 MG 24 hr capsule Commonly known as: EFFEXOR-XR Stopped by: Howard Pouch, DO       TAKE these medications    B2 100 MG Tabs Take 200 mg by mouth every evening.   CALCIUM 1200 PO Take by mouth.   cetirizine 10 MG tablet Commonly known as: ZYRTEC Take 10 mg by mouth daily.   cholecalciferol 1000 units tablet Commonly known as: VITAMIN D Take 1,000 Units by mouth daily. Sublingual drops   COLLAGEN PO Take by mouth.   Estradiol 10 MCG Tabs vaginal tablet Commonly known as: Vagifem Place 1 tablet (10 mcg total) vaginally 2 (two) times a week.   estradiol 0.1 MG/GM vaginal cream Commonly known as: ESTRACE SMARTSIG:Gram(s) Topical 3 Times a Week   K2 PLUS D3 PO Take 1 Dose by mouth every Monday, Wednesday, and Friday. Vitamin d3 (1000 units)-Vitamin K2 (25 mcg) per dropperful Take 1 dropperful on Mondays, Wednesdays, & Fridays.   MAG-200 PO Take 200 mg by mouth every evening.   OVER THE COUNTER MEDICATION VisionMD softgels   PROBIOTIC-PREBIOTIC PO Take by mouth.   rizatriptan 10 MG tablet Commonly known as: MAXALT Take 1 tablet (10 mg total) by mouth as needed for migraine. May repeat in 2 hours if needed   TURMERIC-GINGER PO Take by mouth.   VISION PLUS PO Take by mouth.        All past medical history, surgical  history, allergies, family history, immunizations andmedications were updated in the EMR today and reviewed under the history and medication portions of their EMR.     No results found for this or any previous visit (from the past 2160 hour(s)).   ROS 14 pt review of systems performed and negative (unless mentioned in an HPI) Objective: BP 93/61   Pulse 80   Temp 98.2 F (36.8 C) (Oral)   Ht '5\' 5"'$  (1.651 m)   Wt 122 lb (  55.3 kg)   LMP 09/24/2017 (Exact Date)   SpO2 99%   BMI 20.30 kg/m  Physical Exam Vitals and nursing note reviewed.  Constitutional:      General: She is not in acute distress.    Appearance: Normal appearance. She is normal weight. She is not ill-appearing or toxic-appearing.  Eyes:     Extraocular Movements: Extraocular movements intact.     Conjunctiva/sclera: Conjunctivae normal.     Pupils: Pupils are equal, round, and reactive to light.  Cardiovascular:     Rate and Rhythm: Normal rate and regular rhythm.     Heart sounds: No murmur heard. Pulmonary:     Effort: Pulmonary effort is normal. No respiratory distress.     Breath sounds: No wheezing, rhonchi or rales.  Musculoskeletal:     Right lower leg: No edema.     Left lower leg: No edema.  Neurological:     Mental Status: She is alert and oriented to person, place, and time. Mental status is at baseline.  Psychiatric:        Mood and Affect: Mood normal.        Behavior: Behavior normal.        Thought Content: Thought content normal.        Judgment: Judgment normal.      No results found.  Assessment/plan: Lealer Marsland is a 56 y.o. female present for CPE/CMC Migraine without aura and without status migrainosus, not intractable Stable continue  Maxalt as needed   Osteopenia of multiple sites - Vitamin D (25 hydroxy)> 29> started vit d 1000u daily> vit d collected today B12 deficiency - B12 1059mg q 2 weeks IM x4 - will provide instructions on subL vs continue injection.     No follow-ups on file.  Orders Placed This Encounter  Procedures   Vitamin B12   Vitamin D (25 hydroxy)    No orders of the defined types were placed in this encounter.  Referral Orders  No referral(s) requested today     Electronically signed by: RHoward Pouch DNewton

## 2021-11-02 NOTE — Patient Instructions (Signed)
Vitamin B12 Deficiency Vitamin B12 deficiency means that your body does not have enough vitamin B12. The body needs this important vitamin: To make red blood cells. To make genes (DNA). To help the nerves work. If you do not have enough vitamin B12 in your body, you can have health problems, such as not having enough red blood cells in the blood (anemia). What are the causes? Not eating enough foods that contain vitamin B12. Not being able to take in (absorb) vitamin B12 from the food that you eat. Certain diseases. A condition in which the body does not make enough of a certain protein. This results in your body not taking in enough vitamin B12. Having a surgery in which part of the stomach or small intestine is taken out. Taking medicines that make it hard for the body to take in vitamin B12. These include: Heartburn medicines. Some medicines that are used to treat diabetes. What increases the risk? Being an older adult. Eating a vegetarian or vegan diet that does not include any foods that come from animals. Not eating enough foods that contain vitamin B12 while you are pregnant. Taking certain medicines. Having alcoholism. What are the signs or symptoms? In some cases, there are no symptoms. If the condition leads to too few blood cells or nerve damage, symptoms can occur, such as: Feeling weak or tired. Not being hungry. Losing feeling (numbness) or tingling in your hands and feet. Redness and burning of the tongue. Feeling sad (depressed). Confusion or memory problems. Trouble walking. If anemia is very bad, symptoms can include: Being short of breath. Being dizzy. Having a very fast heartbeat. How is this treated? Changing the way you eat and drink, such as: Eating more foods that contain vitamin B12. Drinking little or no alcohol. Getting vitamin B12 shots. Taking vitamin B12 supplements by mouth (orally). Your doctor will tell you the dose that is best for you. Follow  these instructions at home: Eating and drinking  Eat foods that come from animals and have a lot of vitamin B12 in them. These include: Meats and poultry. This includes beef, pork, chicken, turkey, and organ meats, such as liver. Seafood, such as clams, rainbow trout, salmon, tuna, and haddock. Eggs. Dairy foods such as milk, yogurt, and cheese. Eat breakfast cereals that have vitamin B12 added to them (are fortified). Check the label. The items listed above may not be a complete list of foods and beverages you can eat and drink. Contact a dietitian for more information. Alcohol use Do not drink alcohol if: Your doctor tells you not to drink. You are pregnant, may be pregnant, or are planning to become pregnant. If you drink alcohol: Limit how much you have to: 0-1 drink a day for women. 0-2 drinks a day for men. Know how much alcohol is in your drink. In the [place] one drink equals one 12 oz bottle of beer (355 mL), one 5 oz glass of wine (148 mL), or one 1 oz glass of hard liquor (44 mL). General instructions Get any vitamin B12 shots if told by your doctor. Take supplements only as told by your doctor. Follow the directions. Keep all follow-up visits. Contact a doctor if: Your symptoms come back. Your symptoms get worse or do not get better with treatment. Get help right away if: You have trouble breathing. You have a very fast heartbeat. You have chest pain. You get dizzy. You faint. These symptoms may be an emergency. Get help right away. Call 911.   Do not wait to see if the symptoms will go away. Do not drive yourself to the hospital. Summary Vitamin B12 deficiency means that your body is not getting enough of the vitamin. In some cases, there are no symptoms of this condition. Treatment may include making a change in the way you eat and drink, getting shots, or taking supplements. Eat foods that have vitamin B12 in them. This information is not intended to replace advice  given to you by your health care provider. Make sure you discuss any questions you have with your health care provider. Document Revised: 11/26/2020 Document Reviewed: 11/26/2020 Elsevier Patient Education  2023 Elsevier Inc.  

## 2021-11-03 ENCOUNTER — Telehealth: Payer: Self-pay

## 2021-11-03 ENCOUNTER — Encounter: Payer: Self-pay | Admitting: Family Medicine

## 2021-11-03 NOTE — Telephone Encounter (Signed)
Patient returning call regarding test results. Please call back when available.

## 2021-11-03 NOTE — Telephone Encounter (Signed)
Spoke with pt regarding labs and instructions.   

## 2021-11-04 NOTE — Telephone Encounter (Signed)
Recommendations eas follows: -vitamin D3 OTC 2000 -3000 units daily total -B12 sublingual 1000 units daily. I really do not understand her dropper/home dose math on this in her message.  1000 mcg daily= 7077mg/weekly. If she is using 30051m solution then she would can take 2.5 doses a week and that would be close enough. - continue to have monthly B12 injections by nurse visit.

## 2021-11-04 NOTE — Telephone Encounter (Signed)
Inj are 1 a month please advise on remaining message.

## 2021-11-08 DIAGNOSIS — F4323 Adjustment disorder with mixed anxiety and depressed mood: Secondary | ICD-10-CM | POA: Diagnosis not present

## 2021-11-10 DIAGNOSIS — F331 Major depressive disorder, recurrent, moderate: Secondary | ICD-10-CM | POA: Diagnosis not present

## 2021-11-18 DIAGNOSIS — F340 Cyclothymic disorder: Secondary | ICD-10-CM | POA: Diagnosis not present

## 2021-11-18 DIAGNOSIS — F331 Major depressive disorder, recurrent, moderate: Secondary | ICD-10-CM | POA: Diagnosis not present

## 2021-11-21 ENCOUNTER — Ambulatory Visit (INDEPENDENT_AMBULATORY_CARE_PROVIDER_SITE_OTHER): Payer: BC Managed Care – PPO

## 2021-11-21 DIAGNOSIS — E538 Deficiency of other specified B group vitamins: Secondary | ICD-10-CM | POA: Diagnosis not present

## 2021-11-21 MED ORDER — CYANOCOBALAMIN 1000 MCG/ML IJ SOLN
1000.0000 ug | Freq: Once | INTRAMUSCULAR | Status: AC
  Administered 2021-11-21: 1000 ug via INTRAMUSCULAR

## 2021-11-21 NOTE — Progress Notes (Signed)
Pt here for B12 injection per [facility]   B12 1028mg given IM, and pt tolerated injection well.   Next B12 injection scheduled for n/a

## 2021-11-21 NOTE — Telephone Encounter (Signed)
Please advise on f/u

## 2021-11-21 NOTE — Telephone Encounter (Signed)
We would not need to follow-up sooner than her April appointment since levels of vitamin D and B12 are now normal.  In the minor changes we are making in the dosages, would only be beneficial and increase the vitamin D and B12 levels more. However she wants to see how her levels are doing, we can plan on a scheduled follow-up in about 3-4 months.

## 2021-11-22 DIAGNOSIS — F4323 Adjustment disorder with mixed anxiety and depressed mood: Secondary | ICD-10-CM | POA: Diagnosis not present

## 2021-12-06 DIAGNOSIS — F4323 Adjustment disorder with mixed anxiety and depressed mood: Secondary | ICD-10-CM | POA: Diagnosis not present

## 2021-12-07 ENCOUNTER — Ambulatory Visit: Payer: BC Managed Care – PPO

## 2021-12-13 DIAGNOSIS — F331 Major depressive disorder, recurrent, moderate: Secondary | ICD-10-CM | POA: Diagnosis not present

## 2021-12-13 DIAGNOSIS — F340 Cyclothymic disorder: Secondary | ICD-10-CM | POA: Diagnosis not present

## 2021-12-16 ENCOUNTER — Ambulatory Visit: Payer: BC Managed Care – PPO

## 2021-12-22 DIAGNOSIS — F4323 Adjustment disorder with mixed anxiety and depressed mood: Secondary | ICD-10-CM | POA: Diagnosis not present

## 2022-01-02 DIAGNOSIS — F4323 Adjustment disorder with mixed anxiety and depressed mood: Secondary | ICD-10-CM | POA: Diagnosis not present

## 2022-01-16 DIAGNOSIS — F4323 Adjustment disorder with mixed anxiety and depressed mood: Secondary | ICD-10-CM | POA: Diagnosis not present

## 2022-01-17 ENCOUNTER — Ambulatory Visit (INDEPENDENT_AMBULATORY_CARE_PROVIDER_SITE_OTHER): Payer: BC Managed Care – PPO

## 2022-01-17 DIAGNOSIS — E538 Deficiency of other specified B group vitamins: Secondary | ICD-10-CM

## 2022-01-17 MED ORDER — CYANOCOBALAMIN 1000 MCG/ML IJ SOLN
1000.0000 ug | Freq: Once | INTRAMUSCULAR | Status: AC
  Administered 2022-01-17: 1000 ug via INTRAMUSCULAR

## 2022-01-17 NOTE — Progress Notes (Signed)
Pt here for monthly b12 injections, per Dr.Kuneff. Cyanocobalamin 1066mg given left deltoid IM, pt tolerated injection well. Next injection is scheduled for 02/15/22.

## 2022-01-24 DIAGNOSIS — F4323 Adjustment disorder with mixed anxiety and depressed mood: Secondary | ICD-10-CM | POA: Diagnosis not present

## 2022-01-30 DIAGNOSIS — F4323 Adjustment disorder with mixed anxiety and depressed mood: Secondary | ICD-10-CM | POA: Diagnosis not present

## 2022-02-13 DIAGNOSIS — F4323 Adjustment disorder with mixed anxiety and depressed mood: Secondary | ICD-10-CM | POA: Diagnosis not present

## 2022-02-15 ENCOUNTER — Ambulatory Visit: Payer: BC Managed Care – PPO

## 2022-02-16 ENCOUNTER — Ambulatory Visit: Payer: BC Managed Care – PPO

## 2022-03-07 DIAGNOSIS — H2513 Age-related nuclear cataract, bilateral: Secondary | ICD-10-CM | POA: Diagnosis not present

## 2022-03-07 DIAGNOSIS — H524 Presbyopia: Secondary | ICD-10-CM | POA: Diagnosis not present

## 2022-03-07 DIAGNOSIS — H04123 Dry eye syndrome of bilateral lacrimal glands: Secondary | ICD-10-CM | POA: Diagnosis not present

## 2022-03-07 DIAGNOSIS — H4423 Degenerative myopia, bilateral: Secondary | ICD-10-CM | POA: Diagnosis not present

## 2022-03-07 DIAGNOSIS — H43813 Vitreous degeneration, bilateral: Secondary | ICD-10-CM | POA: Diagnosis not present

## 2022-03-07 DIAGNOSIS — H5213 Myopia, bilateral: Secondary | ICD-10-CM | POA: Diagnosis not present

## 2022-03-13 DIAGNOSIS — F4323 Adjustment disorder with mixed anxiety and depressed mood: Secondary | ICD-10-CM | POA: Diagnosis not present

## 2022-03-14 ENCOUNTER — Ambulatory Visit (INDEPENDENT_AMBULATORY_CARE_PROVIDER_SITE_OTHER): Payer: BC Managed Care – PPO

## 2022-03-14 DIAGNOSIS — E538 Deficiency of other specified B group vitamins: Secondary | ICD-10-CM | POA: Diagnosis not present

## 2022-03-14 MED ORDER — CYANOCOBALAMIN 1000 MCG/ML IJ SOLN
1000.0000 ug | Freq: Once | INTRAMUSCULAR | Status: AC
  Administered 2022-03-14: 1000 ug via INTRAMUSCULAR

## 2022-03-14 NOTE — Progress Notes (Signed)
Pt here for monthly B12 injection per Dr.Kuneff  B12 1079mg given IM, and pt tolerated injection well.  Next B12 injection scheduled for n/a.

## 2022-03-21 ENCOUNTER — Ambulatory Visit: Payer: BC Managed Care – PPO | Admitting: Family Medicine

## 2022-04-11 ENCOUNTER — Ambulatory Visit: Payer: BC Managed Care – PPO

## 2022-04-12 ENCOUNTER — Ambulatory Visit (INDEPENDENT_AMBULATORY_CARE_PROVIDER_SITE_OTHER): Payer: BC Managed Care – PPO

## 2022-04-12 DIAGNOSIS — E538 Deficiency of other specified B group vitamins: Secondary | ICD-10-CM

## 2022-04-12 MED ORDER — CYANOCOBALAMIN 1000 MCG/ML IJ SOLN
1000.0000 ug | Freq: Once | INTRAMUSCULAR | Status: AC
  Administered 2022-04-12: 1000 ug via INTRAMUSCULAR

## 2022-04-12 NOTE — Progress Notes (Signed)
Pt here for monthly B12 injection per Dr.Kuneff  B12 1085mg given IM, and pt tolerated injection well.  Next B12 injection scheduled for 05/15/22.   Please sign off in PCP absence

## 2022-04-13 ENCOUNTER — Ambulatory Visit: Payer: BC Managed Care – PPO

## 2022-04-18 DIAGNOSIS — F4323 Adjustment disorder with mixed anxiety and depressed mood: Secondary | ICD-10-CM | POA: Diagnosis not present

## 2022-05-01 DIAGNOSIS — F4323 Adjustment disorder with mixed anxiety and depressed mood: Secondary | ICD-10-CM | POA: Diagnosis not present

## 2022-05-09 DIAGNOSIS — F4323 Adjustment disorder with mixed anxiety and depressed mood: Secondary | ICD-10-CM | POA: Diagnosis not present

## 2022-05-15 ENCOUNTER — Ambulatory Visit (INDEPENDENT_AMBULATORY_CARE_PROVIDER_SITE_OTHER): Payer: BC Managed Care – PPO

## 2022-05-15 DIAGNOSIS — E538 Deficiency of other specified B group vitamins: Secondary | ICD-10-CM

## 2022-05-15 MED ORDER — CYANOCOBALAMIN 1000 MCG/ML IJ SOLN
1000.0000 ug | Freq: Once | INTRAMUSCULAR | Status: AC
  Administered 2022-05-15: 1000 ug via INTRAMUSCULAR

## 2022-05-15 NOTE — Progress Notes (Unsigned)
Pt here for monthly B12 injection per Dr.Kuneff  B12 109mg given IM, and pt tolerated injection well.  Next B12 injection scheduled for 06/13/22.

## 2022-05-29 DIAGNOSIS — F4323 Adjustment disorder with mixed anxiety and depressed mood: Secondary | ICD-10-CM | POA: Diagnosis not present

## 2022-06-08 DIAGNOSIS — F4323 Adjustment disorder with mixed anxiety and depressed mood: Secondary | ICD-10-CM | POA: Diagnosis not present

## 2022-06-12 DIAGNOSIS — F340 Cyclothymic disorder: Secondary | ICD-10-CM | POA: Diagnosis not present

## 2022-06-12 DIAGNOSIS — F331 Major depressive disorder, recurrent, moderate: Secondary | ICD-10-CM | POA: Diagnosis not present

## 2022-06-13 ENCOUNTER — Ambulatory Visit (INDEPENDENT_AMBULATORY_CARE_PROVIDER_SITE_OTHER): Payer: BC Managed Care – PPO

## 2022-06-13 DIAGNOSIS — E538 Deficiency of other specified B group vitamins: Secondary | ICD-10-CM

## 2022-06-13 MED ORDER — CYANOCOBALAMIN 1000 MCG/ML IJ SOLN
1000.0000 ug | Freq: Once | INTRAMUSCULAR | Status: AC
  Administered 2022-06-13: 1000 ug via INTRAMUSCULAR

## 2022-06-13 NOTE — Progress Notes (Signed)
Pt here for monthly B12 injection per Dr.Kuneff   B12 1066mg given IM, and pt tolerated injection well.   Next B12 injection scheduled for 1 month.

## 2022-06-19 DIAGNOSIS — Z1231 Encounter for screening mammogram for malignant neoplasm of breast: Secondary | ICD-10-CM | POA: Diagnosis not present

## 2022-06-19 DIAGNOSIS — Z681 Body mass index (BMI) 19 or less, adult: Secondary | ICD-10-CM | POA: Diagnosis not present

## 2022-06-19 DIAGNOSIS — Z01419 Encounter for gynecological examination (general) (routine) without abnormal findings: Secondary | ICD-10-CM | POA: Diagnosis not present

## 2022-06-19 LAB — HM MAMMOGRAPHY

## 2022-06-22 DIAGNOSIS — F4323 Adjustment disorder with mixed anxiety and depressed mood: Secondary | ICD-10-CM | POA: Diagnosis not present

## 2022-06-27 DIAGNOSIS — F4323 Adjustment disorder with mixed anxiety and depressed mood: Secondary | ICD-10-CM | POA: Diagnosis not present

## 2022-07-13 ENCOUNTER — Ambulatory Visit (INDEPENDENT_AMBULATORY_CARE_PROVIDER_SITE_OTHER): Payer: BC Managed Care – PPO

## 2022-07-13 DIAGNOSIS — E538 Deficiency of other specified B group vitamins: Secondary | ICD-10-CM | POA: Diagnosis not present

## 2022-07-13 MED ORDER — CYANOCOBALAMIN 1000 MCG/ML IJ SOLN
1000.0000 ug | Freq: Once | INTRAMUSCULAR | Status: AC
  Administered 2022-07-13: 1000 ug via INTRAMUSCULAR

## 2022-07-13 NOTE — Progress Notes (Signed)
Pt here for monthly B12 injection per    B12 1000mcg given IM, and pt tolerated injection well.   Next B12 injection scheduled for 1 month 

## 2022-07-14 DIAGNOSIS — F4323 Adjustment disorder with mixed anxiety and depressed mood: Secondary | ICD-10-CM | POA: Diagnosis not present

## 2022-08-01 DIAGNOSIS — F4323 Adjustment disorder with mixed anxiety and depressed mood: Secondary | ICD-10-CM | POA: Diagnosis not present

## 2022-08-07 ENCOUNTER — Encounter: Payer: Self-pay | Admitting: Family Medicine

## 2022-08-07 ENCOUNTER — Encounter: Payer: BC Managed Care – PPO | Admitting: Family Medicine

## 2022-08-07 DIAGNOSIS — F4323 Adjustment disorder with mixed anxiety and depressed mood: Secondary | ICD-10-CM | POA: Diagnosis not present

## 2022-08-07 NOTE — Progress Notes (Signed)
    Same day cancel- migraine

## 2022-08-07 NOTE — Patient Instructions (Signed)
No follow-ups on file.        Great to see you today.  I have refilled the medication(s) we provide.   If labs were collected, we will inform you of lab results once received either by echart message or telephone call.   - echart message- for normal results that have been seen by the patient already.   - telephone call: abnormal results or if patient has not viewed results in their echart.  

## 2022-08-17 ENCOUNTER — Encounter: Payer: Self-pay | Admitting: Family Medicine

## 2022-08-17 ENCOUNTER — Ambulatory Visit (INDEPENDENT_AMBULATORY_CARE_PROVIDER_SITE_OTHER): Payer: BC Managed Care – PPO | Admitting: Family Medicine

## 2022-08-17 VITALS — BP 113/80 | HR 74 | Temp 97.8°F | Wt 117.0 lb

## 2022-08-17 DIAGNOSIS — E538 Deficiency of other specified B group vitamins: Secondary | ICD-10-CM | POA: Diagnosis not present

## 2022-08-17 DIAGNOSIS — E2839 Other primary ovarian failure: Secondary | ICD-10-CM | POA: Diagnosis not present

## 2022-08-17 DIAGNOSIS — M8589 Other specified disorders of bone density and structure, multiple sites: Secondary | ICD-10-CM

## 2022-08-17 DIAGNOSIS — E559 Vitamin D deficiency, unspecified: Secondary | ICD-10-CM

## 2022-08-17 DIAGNOSIS — N952 Postmenopausal atrophic vaginitis: Secondary | ICD-10-CM

## 2022-08-17 DIAGNOSIS — G43009 Migraine without aura, not intractable, without status migrainosus: Secondary | ICD-10-CM

## 2022-08-17 DIAGNOSIS — Z13220 Encounter for screening for lipoid disorders: Secondary | ICD-10-CM

## 2022-08-17 DIAGNOSIS — F330 Major depressive disorder, recurrent, mild: Secondary | ICD-10-CM | POA: Diagnosis not present

## 2022-08-17 DIAGNOSIS — Z0000 Encounter for general adult medical examination without abnormal findings: Secondary | ICD-10-CM | POA: Diagnosis not present

## 2022-08-17 DIAGNOSIS — Z131 Encounter for screening for diabetes mellitus: Secondary | ICD-10-CM | POA: Diagnosis not present

## 2022-08-17 LAB — COMPREHENSIVE METABOLIC PANEL
ALT: 7 U/L (ref 0–35)
AST: 13 U/L (ref 0–37)
Albumin: 4.1 g/dL (ref 3.5–5.2)
Alkaline Phosphatase: 42 U/L (ref 39–117)
BUN: 11 mg/dL (ref 6–23)
CO2: 28 mEq/L (ref 19–32)
Calcium: 9.1 mg/dL (ref 8.4–10.5)
Chloride: 104 mEq/L (ref 96–112)
Creatinine, Ser: 0.69 mg/dL (ref 0.40–1.20)
GFR: 96.93 mL/min (ref >60.00)
Glucose, Bld: 80 mg/dL (ref 70–99)
Potassium: 4.2 mEq/L (ref 3.5–5.1)
Sodium: 139 mEq/L (ref 135–145)
Total Bilirubin: 0.5 mg/dL (ref 0.2–1.2)
Total Protein: 6.1 g/dL (ref 6.0–8.3)

## 2022-08-17 LAB — CBC WITH DIFFERENTIAL/PLATELET
Basophils Absolute: 0 10*3/uL (ref 0.0–0.1)
Basophils Relative: 1.3 % (ref 0.0–3.0)
Eosinophils Absolute: 0 10*3/uL (ref 0.0–0.7)
Eosinophils Relative: 1.1 % (ref 0.0–5.0)
HCT: 39.1 % (ref 36.0–46.0)
Hemoglobin: 12.9 g/dL (ref 12.0–15.0)
Lymphocytes Relative: 35 % (ref 12.0–46.0)
Lymphs Abs: 1.1 10*3/uL (ref 0.7–4.0)
MCHC: 32.9 g/dL (ref 30.0–36.0)
MCV: 93.9 fl (ref 78.0–100.0)
Monocytes Absolute: 0.2 10*3/uL (ref 0.1–1.0)
Monocytes Relative: 7.7 % (ref 3.0–12.0)
Neutro Abs: 1.7 10*3/uL (ref 1.4–7.7)
Neutrophils Relative %: 54.9 % (ref 43.0–77.0)
Platelets: 233 10*3/uL (ref 150.0–400.0)
RBC: 4.17 Mil/uL (ref 3.87–5.11)
RDW: 12.3 % (ref 11.5–15.5)
WBC: 3 10*3/uL — ABNORMAL LOW (ref 4.0–10.5)

## 2022-08-17 LAB — LIPID PANEL
Cholesterol: 166 mg/dL (ref 0–200)
HDL: 50.3 mg/dL (ref >39.00)
LDL Cholesterol: 88 mg/dL (ref 0–99)
NonHDL: 116.12
Total CHOL/HDL Ratio: 3
Triglycerides: 141 mg/dL (ref 0.0–149.0)
VLDL: 28.2 mg/dL (ref 0.0–40.0)

## 2022-08-17 LAB — HEMOGLOBIN A1C: Hgb A1c MFr Bld: 5 % (ref 4.6–6.5)

## 2022-08-17 LAB — TSH: TSH: 2.93 u[IU]/mL (ref 0.35–5.50)

## 2022-08-17 LAB — VITAMIN D 25 HYDROXY (VIT D DEFICIENCY, FRACTURES): VITD: 39.39 ng/mL (ref 30.00–100.00)

## 2022-08-17 LAB — VITAMIN B12: Vitamin B-12: 1357 pg/mL — ABNORMAL HIGH (ref 211–911)

## 2022-08-17 LAB — MAGNESIUM: Magnesium: 2 mg/dL (ref 1.5–2.5)

## 2022-08-17 MED ORDER — BUPROPION HCL ER (XL) 150 MG PO TB24: 150.0000 mg | ORAL_TABLET | Freq: Every day | ORAL | 1 refills | Status: DC

## 2022-08-17 MED ORDER — CYANOCOBALAMIN 1000 MCG/ML IJ SOLN
1000.0000 ug | Freq: Once | INTRAMUSCULAR | Status: AC
Start: 2022-08-17 — End: 2022-08-17
  Administered 2022-08-17: 1000 ug via INTRAMUSCULAR

## 2022-08-17 MED ORDER — RIZATRIPTAN BENZOATE 10 MG PO TABS: 10.0000 mg | ORAL_TABLET | ORAL | 11 refills | Status: DC | PRN

## 2022-08-17 NOTE — Progress Notes (Signed)
Patient ID: Sara Davidson, female  DOB: 10/09/1965, 58 y.o.   MRN: [id number] Patient Care Team    Relationship Specialty Notifications Start End  Natalia Leatherwood, DO PCP - General Family Medicine  03/04/21   Archer Asa, MD Consulting Physician Psychiatry  03/04/21   Dannielle Huh, MD Consulting Physician Orthopedic Surgery  03/04/21   Adelene Amas, [facility]  Behavioral Health  03/04/21   Annamaria Helling, MD Consulting Physician Obstetrics and Gynecology  03/04/21   Charlott Rakes, MD Consulting Physician Gastroenterology  03/04/21   Janet Berlin, MD Consulting Physician Ophthalmology  03/04/21     Chief Complaint  Patient presents with   Annual Exam    Pt is fasting; [facility]    Subjective: Sara Davidson is a 57 y.o.  Female  present for CPE and Chronic Conditions/illness Management  All past medical history, surgical history, allergies, family history, immunizations, medications and social history were updated in the electronic medical record today. All recent labs, ED visits and hospitalizations within the last year were reviewed.  Health maintenance:  Colonoscopy: completed 05/25/2016, by Dr. Magda Bernheim up 10 yrs.  Mammogram: completed:06/2022,  at [facility] Cervical cancer screening: Hysterectomy Immunizations: tdap UTD 2022, Influenza (encouraged yearly), shingrix completed Infectious disease screening: HIV completed, Hep C completed today DEXA: last completed 03/24/2021, result osteopenia, follow needed 2-3 yrs ([place] image) Patient has a Dental home. Hospitalizations/ED visits: reviewed  Depression: Patient has a history of depression and some mild anxiety per her report.  She is being treated with Wellbutrin 150 mg daily.  She is established with a therapist which she sees routinely and has a good relationship with.  Wellbutrin was being prescribed by psychiatry and she would like to have this medication taken over by this provider  today.  Migraine: Patient reports she uses Maxalt for her migraines, and it works well for her She states her triggers typically are barometric pressure and different scents that are strong.   Postmenopausal syndrome: She reports hysterectomy secondary to fibroids and menorrhagia.  Her ovaries are intact.  She reports her gynecologist states she is now menopausal secondary to her hormone levels.  She has been prescribed Vagifem tablets twice weekly to help with her atrophic vaginitis secondary to menopause.  She feels this works well for her.  Continues to follow with gynecology as indicated for pelvic exams.      03/04/2021    2:33 PM 12/21/2017   11:09 AM 12/12/2017   10:18 AM 05/21/2017   11:28 AM 08/30/2014   10:27 AM  Depression screen PHQ 2/9  Decreased Interest 0 0 0 0 0  Down, Depressed, Hopeless 0 0 0 0 0  PHQ - 2 Score 0 0 0 0 0       No data to display          Immunization History  Administered Date(s) Administered   Influenza,inj,Quad PF,6+ Mos 01/25/2016   Influenza-Unspecified 02/10/2014, 01/15/2021   PFIZER(Purple Top)SARS-COV-2 Vaccination 05/31/2019, 06/24/2019, 08/03/2020   Pfizer Covid-19 Vaccine Bivalent Booster 46yrs & up 01/20/2021   Td 08/26/2020   Zoster Recombinat (Shingrix) 08/26/2020, 03/04/2021    Past Medical History:  Diagnosis Date   Anemia    Anxiety    Chicken pox    CIN I (cervical intraepithelial neoplasia I)    Contact lens/glasses fitting    wears contacts or glasses   COVID-19 01/2021   Cyclical mastalgia    Depression    Headache    migraines   Papanicolaou smear  of vagina with high grade squamous intraepithelial lesion (HGSIL)    Allergies  Allergen Reactions   Sulfa Antibiotics Hives   Latex Other (See Comments)   Past Surgical History:  Procedure Laterality Date   BREAST BIOPSY Right 07/04/2012   Procedure: Right Breast Wire Localized Excision;  Surgeon: Emelia Loron, MD;  Location: [facility];   Service: General;  Laterality: Right;   CERVICAL CONIZATION W/BX  2004   FOOT BONE EXCISION Right 03/2013   RIGHT FOOT 5TH METATARSAL SURGERY WITH PIN   INGUINAL HERNIA REPAIR Right 2015   WITH MESH   KNEE ARTHROSCOPY W/ ACL RECONSTRUCTION  1610,9604   Left   LAPAROSCOPIC VAGINAL HYSTERECTOMY WITH SALPINGECTOMY Bilateral 09/26/2017   Procedure: LAPAROSCOPIC ASSISTED VAGINAL HYSTERECTOMY WITH SALPINGECTOMY;  Surgeon: Marlow Baars, MD;  Location: [facility];  Service: Gynecology;  Laterality: Bilateral;  DR Chestine Spore REQUESTING 3 HOURS OF OR TIME   LIPOMA EXCISION  2008   lt forearm   METATARSAL OSTEOTOMY     SHOULDER ARTHROSCOPY Left 01/2013   SHOULDER ARTHROSCOPY     UMBILICAL HERNIA REPAIR  2011   UMB   Family History  Problem Relation Age of Onset   Stroke Mother    Hyperlipidemia Mother    Arthritis Mother    Breast cancer Mother    Depression Father    Learning disabilities Father    Hearing loss Father    Early death Father    Bipolar disorder Father    Suicidality Father    Learning disabilities Daughter    Depression Daughter    Asthma Daughter    Depression Daughter    Arthritis Maternal Grandmother    Heart attack Maternal Grandfather    Arthritis Paternal Grandmother    Heart attack Paternal Grandfather    Social History   Social History Narrative   Marital status/children/pets: Married.  2 grown children.   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 If you have any questions, ask your nurse or doctor.          B2 100 MG Tabs Take 200 mg by mouth every evening.   buPROPion 150 MG 24 hr  tablet Commonly known as: WELLBUTRIN XL Take 1 tablet (150 mg total) by mouth daily.   CALCIUM 1200 PO Take by mouth.   cetirizine 10 MG tablet Commonly known as: ZYRTEC Take 10 mg by mouth daily.   cholecalciferol 1000 units tablet Commonly known as: VITAMIN D Take 1,000 Units by mouth daily. Sublingual drops   COLLAGEN PO Take by mouth.   Estradiol 10 MCG Tabs vaginal tablet Commonly known as: Vagifem Place 1 tablet (10 mcg total) vaginally 2 (two) times a week. What changed: when to take this   estradiol 0.1 MG/GM vaginal cream Commonly known as: ESTRACE SMARTSIG:Gram(s) Topical 3 Times a Week What changed: Another medication with the same name was changed. Make sure you understand how and when to  take each.   K2 PLUS D3 PO Take 1 Dose by mouth every Monday, Wednesday, and Friday. Vitamin d3 (1000 units)-Vitamin K2 (25 mcg) per dropperful Take 1 dropperful on Mondays, Wednesdays, & Fridays.   MAG-200 PO Take 200 mg by mouth every evening.   OVER THE COUNTER MEDICATION VisionMD softgels   PROBIOTIC-PREBIOTIC PO Take by mouth.   rizatriptan 10 MG tablet Commonly known as: MAXALT Take 1 tablet (10 mg total) by mouth as needed for migraine. May repeat in 2 hours if needed   TURMERIC-GINGER PO Take by mouth.   VISION PLUS PO Take by mouth.        All past medical history, surgical history, allergies, family history, immunizations andmedications were updated in the EMR today and reviewed under the history and medication portions of their EMR.     Recent Results (from the past 2160 hour(s))  HM MAMMOGRAPHY     Status: None   Collection Time: 06/19/22  1:54 PM  Result Value Ref Range   HM Mammogram 0-4 Bi-Rad 0-4 Bi-Rad, Self Reported Normal     Review of Systems  Constitutional: Negative.   HENT: Negative.    Eyes: Negative.   Respiratory: Negative.    Cardiovascular: Negative.   Gastrointestinal: Negative.   Genitourinary: Negative.    Musculoskeletal: Negative.   Skin: Negative.   Neurological: Negative.   Endo/Heme/Allergies: Negative.   Psychiatric/Behavioral: Negative.    All other systems reviewed and are negative.  14 pt review of systems performed and negative (unless mentioned in an HPI)  Objective: BP 113/80   Pulse 74   Temp 97.8 F (36.6 C)   Wt 117 lb (53.1 kg)   LMP 09/24/2017 (Exact Date)   SpO2 100%   BMI 19.47 kg/m  Physical Exam Vitals and nursing note reviewed.  Constitutional:      General: She is not in acute distress.    Appearance: Normal appearance. She is not ill-appearing or toxic-appearing.  HENT:     Head: Normocephalic and atraumatic.     Right Ear: Tympanic membrane, ear canal and external ear normal. There is no impacted cerumen.     Left Ear: Tympanic membrane, ear canal and external ear normal. There is no impacted cerumen.     Nose: No congestion or rhinorrhea.     Mouth/Throat:     Mouth: Mucous membranes are moist.     Pharynx: Oropharynx is clear. No oropharyngeal exudate or posterior oropharyngeal erythema.  Eyes:     General: No scleral icterus.       Right eye: No discharge.        Left eye: No discharge.     Extraocular Movements: Extraocular movements intact.     Conjunctiva/sclera: Conjunctivae normal.     Pupils: Pupils are equal, round, and reactive to light.  Cardiovascular:     Rate and Rhythm: Normal rate and regular rhythm.     Pulses: Normal pulses.     Heart sounds: Normal heart sounds. No murmur heard.    No friction rub. No gallop.  Pulmonary:     Effort: Pulmonary effort is normal. No respiratory distress.     Breath sounds: Normal breath sounds. No stridor. No wheezing, rhonchi or rales.  Chest:     Chest wall: No tenderness.  Abdominal:     General: Abdomen is flat. Bowel sounds are normal. There is no distension.     Palpations: Abdomen is soft. There is no mass.     Tenderness: There is  no abdominal tenderness. There is no right CVA  tenderness, left CVA tenderness, guarding or rebound.     Hernia: No hernia is present.  Musculoskeletal:        General: No swelling, tenderness or deformity. Normal range of motion.     Cervical back: Normal range of motion and neck supple. No rigidity or tenderness.     Right lower leg: No edema.     Left lower leg: No edema.  Lymphadenopathy:     Cervical: No cervical adenopathy.  Skin:    General: Skin is warm and dry.     Coloration: Skin is not jaundiced or pale.     Findings: No bruising, erythema, lesion or rash.  Neurological:     General: No focal deficit present.     Mental Status: She is alert and oriented to person, place, and time. Mental status is at baseline.     Cranial Nerves: No cranial nerve deficit.     Sensory: No sensory deficit.     Motor: No weakness.     Coordination: Coordination normal.     Gait: Gait normal.     Deep Tendon Reflexes: Reflexes normal.  Psychiatric:        Mood and Affect: Mood normal.        Behavior: Behavior normal.        Thought Content: Thought content normal.        Judgment: Judgment normal.      No results found.  Assessment/plan: Sara Davidson is a 57 y.o. female present for CPE and chronic condition follow-up Migraine without aura and without status migrainosus, not intractable Stable Continue Maxalt as needed Magnesium levels collected today  Postmenopausal/Atrophic vaginitis/Hormone replacement therapy She will continue her pelvic exams with her gynecologist Stable Vagifem now prescribed with gyn  Osteopenia of multiple sites Vitamin D collected today. DEXA due December 2024  Vitamin B deficiency: Vitamin B levels collected today. Regularly scheduled B12 IM injection provided today.  Depression: Stable. Agreed to take over Wellbutrin 150 mg daily. Continue routine therapy sessions with therapist. Follow-up in 5 and half months.  Osteopenia of multiple sites/Vitamin D deficiency - Vitamin D (25  hydroxy) DEXA due beginning of 2025 B12 deficiency - B12 - cyanocobalamin (VITAMIN B12) injection 1,000 mcg Lipid screening - Lipid panel Diabetes mellitus screening - Hemoglobin A1c  Routine general medical examination at a health care facility Patient was encouraged to exercise greater than 150 minutes a week. Patient was encouraged to choose a diet filled with fresh fruits and vegetables, and lean meats. AVS provided to patient today for education/recommendation on gender specific health and safety maintenance. Colonoscopy: completed 05/25/2016, by Dr. Magda Bernheim up 10 yrs.  Mammogram: completed:06/2022,  at [facility] Cervical cancer screening: Hysterectomy Immunizations: tdap UTD 2022, Influenza (encouraged yearly), shingrix completed Infectious disease screening: HIV completed, Hep C completed today DEXA: last completed 03/24/2021, result osteopenia, follow needed 2-3 yrs ([place] image) - CBC with Differential/Platelet - Comprehensive metabolic panel - Hemoglobin A1c - Lipid panel - TSH Return in about 24 weeks (around 02/01/2023) for Routine chronic condition follow-up.  Orders Placed This Encounter  Procedures   CBC with Differential/Platelet   Comprehensive metabolic panel   Hemoglobin A1c   Lipid panel   TSH   Vitamin D (25 hydroxy)   B12   Magnesium    Meds ordered this encounter  Medications   rizatriptan (MAXALT) 10 MG tablet    Sig: Take 1 tablet (10 mg total) by mouth as needed  for migraine. May repeat in 2 hours if needed    Dispense:  10 tablet    Refill:  11   cyanocobalamin (VITAMIN B12) injection 1,000 mcg   buPROPion (WELLBUTRIN XL) 150 MG 24 hr tablet    Sig: Take 1 tablet (150 mg total) by mouth daily.    Dispense:  90 tablet    Refill:  1    Dc all prior scripts   Referral Orders  No referral(s) requested today     Electronically signed by: Felix Pacini, DO [facility] [place]

## 2022-08-17 NOTE — Patient Instructions (Signed)
No follow-ups on file.        Great to see you today.  I have refilled the medication(s) we provide.   If labs were collected, we will inform you of lab results once received either by echart message or telephone call.   - echart message- for normal results that have been seen by the patient already.   - telephone call: abnormal results or if patient has not viewed results in their echart.  

## 2022-08-18 ENCOUNTER — Encounter: Payer: Self-pay | Admitting: Family Medicine

## 2022-08-23 DIAGNOSIS — H6123 Impacted cerumen, bilateral: Secondary | ICD-10-CM | POA: Diagnosis not present

## 2022-08-23 DIAGNOSIS — H9042 Sensorineural hearing loss, unilateral, left ear, with unrestricted hearing on the contralateral side: Secondary | ICD-10-CM | POA: Diagnosis not present

## 2022-08-31 ENCOUNTER — Encounter: Payer: Self-pay | Admitting: Family Medicine

## 2022-09-01 DIAGNOSIS — F4323 Adjustment disorder with mixed anxiety and depressed mood: Secondary | ICD-10-CM | POA: Diagnosis not present

## 2022-09-06 ENCOUNTER — Encounter: Payer: Self-pay | Admitting: Family Medicine

## 2022-09-18 ENCOUNTER — Ambulatory Visit: Payer: BC Managed Care – PPO

## 2022-09-20 DIAGNOSIS — F4323 Adjustment disorder with mixed anxiety and depressed mood: Secondary | ICD-10-CM | POA: Diagnosis not present

## 2022-10-10 DIAGNOSIS — F4323 Adjustment disorder with mixed anxiety and depressed mood: Secondary | ICD-10-CM | POA: Diagnosis not present

## 2022-10-12 DIAGNOSIS — N760 Acute vaginitis: Secondary | ICD-10-CM | POA: Diagnosis not present

## 2022-10-12 DIAGNOSIS — N762 Acute vulvitis: Secondary | ICD-10-CM | POA: Diagnosis not present

## 2022-10-24 DIAGNOSIS — Z0000 Encounter for general adult medical examination without abnormal findings: Secondary | ICD-10-CM | POA: Diagnosis not present

## 2022-10-25 LAB — LAB REPORT - SCANNED: EGFR: 93

## 2022-11-01 DIAGNOSIS — F4323 Adjustment disorder with mixed anxiety and depressed mood: Secondary | ICD-10-CM | POA: Diagnosis not present

## 2022-11-15 DIAGNOSIS — F4323 Adjustment disorder with mixed anxiety and depressed mood: Secondary | ICD-10-CM | POA: Diagnosis not present

## 2022-12-25 DIAGNOSIS — F4323 Adjustment disorder with mixed anxiety and depressed mood: Secondary | ICD-10-CM | POA: Diagnosis not present

## 2023-01-08 DIAGNOSIS — F4323 Adjustment disorder with mixed anxiety and depressed mood: Secondary | ICD-10-CM | POA: Diagnosis not present

## 2023-01-09 DIAGNOSIS — F330 Major depressive disorder, recurrent, mild: Secondary | ICD-10-CM | POA: Diagnosis not present

## 2023-01-10 ENCOUNTER — Encounter: Payer: Self-pay | Admitting: Family Medicine

## 2023-01-10 ENCOUNTER — Telehealth: Payer: BC Managed Care – PPO | Admitting: Family Medicine

## 2023-01-10 DIAGNOSIS — Z20822 Contact with and (suspected) exposure to covid-19: Secondary | ICD-10-CM

## 2023-01-10 DIAGNOSIS — R519 Headache, unspecified: Secondary | ICD-10-CM | POA: Diagnosis not present

## 2023-01-10 LAB — POC COVID19 BINAXNOW: SARS Coronavirus 2 Ag: NEGATIVE

## 2023-01-10 NOTE — Progress Notes (Signed)
VIRTUAL VISIT VIA VIDEO  I connected with Sara Davidson on 01/10/23 at 11:40 AM EDT by a video enabled telemedicine application and verified that I am speaking with the correct person using two identifiers. Location patient: Home Location provider: [facility], Office Persons participating in the virtual visit: Patient, Dr. Claiborne Billings and Ivonne Andrew, CMA  I discussed the limitations of evaluation and management by telemedicine and the availability of in person appointments. The patient expressed understanding and agreed to proceed.     Sara Davidson , 1965/07/25, 57 y.o., female MRN: [id number] Patient Care Team    Relationship Specialty Notifications Start End  Natalia Leatherwood, DO PCP - General Family Medicine  03/04/21   Archer Asa, MD Consulting Physician Psychiatry  03/04/21   Dannielle Huh, MD Consulting Physician Orthopedic Surgery  03/04/21   Adelene Amas, [facility]  Behavioral Health  03/04/21   Annamaria Helling, MD Consulting Physician Obstetrics and Gynecology  03/04/21   Charlott Rakes, MD Consulting Physician Gastroenterology  03/04/21   Janet Berlin, MD Consulting Physician Ophthalmology  03/04/21     Chief Complaint  Patient presents with   Covid Exposure    C/O HA woke up this morning; husband positive today     Subjective: Sara Davidson is a 57 y.o. Pt presents for an OV with complaints of  headache  of 1 day duration.  Associated symptoms include Her husband has a fever and test positive for covid.     03/04/2021    2:33 PM 12/21/2017   11:09 AM 12/12/2017   10:18 AM 05/21/2017   11:28 AM 08/30/2014   10:27 AM  Depression screen PHQ 2/9  Decreased Interest 0 0 0 0 0  Down, Depressed, Hopeless 0 0 0 0 0  PHQ - 2 Score 0 0 0 0 0    Allergies  Allergen Reactions   Sulfa Antibiotics Hives   Latex Other (See Comments)   Social History   Social History Narrative   Marital status/children/pets: Married.  2 grown children.    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:      -Wears a bicycle helmet riding a bike: Yes     -smoke alarm in the home:Yes     - wears seatbelt: Yes     - Feels safe in their relationships: Yes      Past Medical History:  Diagnosis Date   Anemia    Anxiety    Chicken pox    CIN I (cervical intraepithelial neoplasia I)    Contact lens/glasses fitting    wears contacts or glasses   COVID-19 01/2021   Cyclical mastalgia    Depression    Headache    migraines   Papanicolaou smear of vagina with high grade squamous intraepithelial lesion (HGSIL)    Past Surgical History:  Procedure Laterality Date   BREAST BIOPSY Right 07/04/2012   Procedure: Right Breast Wire Localized Excision;  Surgeon: Emelia Loron, MD;  Location: [facility];  Service: General;  Laterality: Right;   CERVICAL CONIZATION W/BX  2004   FOOT BONE EXCISION Right 03/2013   RIGHT FOOT 5TH METATARSAL SURGERY WITH PIN   INGUINAL HERNIA REPAIR Right 2015   WITH MESH   KNEE ARTHROSCOPY W/ ACL RECONSTRUCTION  [id number]   Left   LAPAROSCOPIC VAGINAL HYSTERECTOMY WITH SALPINGECTOMY Bilateral 09/26/2017   Procedure: LAPAROSCOPIC ASSISTED VAGINAL HYSTERECTOMY WITH SALPINGECTOMY;  Surgeon: Marlow Baars, MD;  Location: [facility];  Service: Gynecology;  Laterality: Bilateral;  DR Chestine Spore REQUESTING 3 HOURS OF OR TIME   LIPOMA EXCISION  2008   lt forearm   METATARSAL OSTEOTOMY     SHOULDER ARTHROSCOPY Left 01/2013   SHOULDER ARTHROSCOPY     UMBILICAL HERNIA REPAIR  2011   UMB   Family History  Problem Relation Age of Onset   Stroke Mother    Hyperlipidemia Mother    Arthritis Mother    Breast cancer Mother    Depression Father    Learning disabilities Father    Hearing loss Father    Early death Father    Bipolar disorder Father    Suicidality Father    Learning disabilities Daughter    Depression Daughter    Asthma Daughter    Depression Daughter     Arthritis Maternal Grandmother    Heart attack Maternal Grandfather    Arthritis Paternal Grandmother    Heart attack Paternal Grandfather    Allergies as of 01/10/2023       Reactions   Sulfa Antibiotics Hives   Latex Other (See Comments)        Medication List        Accurate as of January 10, 2023 11:58 AM. If you have any questions, ask your nurse or doctor.          B2 100 MG Tabs Take 200 mg by mouth every evening.   buPROPion 150 MG 24 hr tablet Commonly known as: WELLBUTRIN XL Take 1 tablet (150 mg total) by mouth daily.   CALCIUM 1200 PO Take by mouth.   cetirizine 10 MG tablet Commonly known as: ZYRTEC Take 10 mg by mouth daily.   cholecalciferol 1000 units tablet Commonly known as: VITAMIN D Take 1,000 Units by mouth daily. Sublingual drops   COLLAGEN PO Take by mouth.   Estradiol 10 MCG Tabs vaginal tablet Commonly known as: Vagifem Place 1 tablet (10 mcg total) vaginally 2 (two) times a week. What changed: when to take this   estradiol 0.1 MG/GM vaginal cream Commonly known as: ESTRACE SMARTSIG:Gram(s) Topical 3 Times a Week What changed: Another medication with the same name was changed. Make sure you understand how and when to take each.   K2 PLUS D3 PO Take 1 Dose by mouth every Monday, Wednesday, and Friday. Vitamin d3 (1000 units)-Vitamin K2 (25 mcg) per dropperful Take 1 dropperful on Mondays, Wednesdays, & Fridays.   MAG-200 PO Take 200 mg by mouth every evening.   OVER THE COUNTER MEDICATION VisionMD softgels   PROBIOTIC-PREBIOTIC PO Take by mouth.   rizatriptan 10 MG tablet Commonly known as: MAXALT Take 1 tablet (10 mg total) by mouth as needed for migraine. May repeat in 2 hours if needed   TURMERIC-GINGER PO Take by mouth.   VISION PLUS PO Take by mouth.        All past medical history, surgical history, allergies, family history, immunizations andmedications were updated in the EMR today and reviewed under  the history and medication portions of their EMR.     ROS Negative, with the exception of above mentioned in HPI   Objective:  LMP 09/24/2017 (Exact Date)  There is no height or weight on file to calculate BMI. Physical Exam Vitals and nursing note reviewed.  Constitutional:      General: She is not in acute distress.    Appearance: Normal appearance. She is not toxic-appearing.  HENT:     Head: Normocephalic and atraumatic.  Eyes:  General: No scleral icterus.       Right eye: No discharge.        Left eye: No discharge.     Conjunctiva/sclera: Conjunctivae normal.  Pulmonary:     Effort: Pulmonary effort is normal.  Musculoskeletal:     Cervical back: Normal range of motion.  Skin:    Findings: No rash.  Neurological:     Mental Status: She is alert and oriented to person, place, and time. Mental status is at baseline.  Psychiatric:        Mood and Affect: Mood normal.        Behavior: Behavior normal.        Thought Content: Thought content normal.        Judgment: Judgment normal.      No results found. No results found. Results for orders placed or performed in visit on 01/10/23 (from the past 24 hour(s))  POC COVID-19 BinaxNow     Status: None   Collection Time: 01/10/23 10:56 AM  Result Value Ref Range   SARS Coronavirus 2 Ag Negative Negative    Assessment/Plan: Sara Davidson is a 57 y.o. female present for OV for  Headache:  Covid negative. Encouraged her to test at home tomorrow to be sure. Rest, hydrate.  mucinex (DM if cough), nettie pot or nasal saline.  OTC headache.  Reviewed home care instructions for COVID. Advised self-isolation at home for at least 5 days. After 5 days, if improved and fever resolved, can be in public, but should wear a mask around others for an additional 5 days. If symptoms, esp, dyspnea develops/worsens, recommend in-person evaluation at either an urgent care or the emergency room.  Reviewed expectations re:  course of current medical issues. Discussed self-management of symptoms. Outlined signs and symptoms indicating need for more acute intervention. Patient verbalized understanding and all questions were answered. Patient received an After-Visit Summary.    Orders Placed This Encounter  Procedures   POC COVID-19 BinaxNow   No orders of the defined types were placed in this encounter.  Referral Orders  No referral(s) requested today     Note is dictated utilizing voice recognition software. Although note has been proof read prior to signing, occasional typographical errors still can be missed. If any questions arise, please do not hesitate to call for verification.   electronically signed by:  Felix Pacini, DO  [facility] [place]

## 2023-01-10 NOTE — Patient Instructions (Signed)

## 2023-01-12 ENCOUNTER — Encounter: Payer: Self-pay | Admitting: Family Medicine

## 2023-01-12 MED ORDER — BENZONATATE 100 MG PO CAPS: 100.0000 mg | ORAL_CAPSULE | Freq: Three times a day (TID) | ORAL | 0 refills | Status: DC

## 2023-01-12 NOTE — Telephone Encounter (Signed)
Pt can purchase OTC robitussin for cough.  Mucinex DM OTC is also an option.  I have called in the tessalon perles for her also.

## 2023-01-15 ENCOUNTER — Telehealth: Payer: Self-pay | Admitting: Family Medicine

## 2023-01-15 NOTE — Telephone Encounter (Signed)
Patient reports that since having Covid she has not had much of an appetite and has not eaten much. She now is reporting nausea for 24 hours which making her not want to eat even more. She is inquiring  if there is something that Dr. Claiborne Billings can call to help with nausea. Please advise patient.

## 2023-01-15 NOTE — Telephone Encounter (Signed)
Please advise 

## 2023-01-16 MED ORDER — ONDANSETRON HCL 4 MG PO TABS: 4.0000 mg | ORAL_TABLET | Freq: Three times a day (TID) | ORAL | 0 refills | Status: DC | PRN

## 2023-01-16 NOTE — Telephone Encounter (Signed)
Can call in a short course of Zofran to help with nausea. Patient should be encouraged if any further symptoms arise, she should make an appointment to follow-up

## 2023-01-23 ENCOUNTER — Encounter: Payer: Self-pay | Admitting: Family Medicine

## 2023-01-23 ENCOUNTER — Ambulatory Visit: Payer: BC Managed Care – PPO | Admitting: Family Medicine

## 2023-01-23 VITALS — BP 92/63 | HR 75 | Temp 98.1°F | Wt 112.8 lb

## 2023-01-23 DIAGNOSIS — B9689 Other specified bacterial agents as the cause of diseases classified elsewhere: Secondary | ICD-10-CM

## 2023-01-23 DIAGNOSIS — U071 COVID-19: Secondary | ICD-10-CM | POA: Diagnosis not present

## 2023-01-23 DIAGNOSIS — J329 Chronic sinusitis, unspecified: Secondary | ICD-10-CM

## 2023-01-23 LAB — CBC WITH DIFFERENTIAL/PLATELET
Basophils Absolute: 0.1 10*3/uL (ref 0.0–0.1)
Basophils Relative: 1.2 % (ref 0.0–3.0)
Eosinophils Absolute: 0 10*3/uL (ref 0.0–0.7)
Eosinophils Relative: 0.6 % (ref 0.0–5.0)
HCT: 41.2 % (ref 36.0–46.0)
Hemoglobin: 13.4 g/dL (ref 12.0–15.0)
Lymphocytes Relative: 33 % (ref 12.0–46.0)
Lymphs Abs: 1.5 10*3/uL (ref 0.7–4.0)
MCHC: 32.5 g/dL (ref 30.0–36.0)
MCV: 93.6 fL (ref 78.0–100.0)
Monocytes Absolute: 0.4 10*3/uL (ref 0.1–1.0)
Monocytes Relative: 9.1 % (ref 3.0–12.0)
Neutro Abs: 2.5 10*3/uL (ref 1.4–7.7)
Neutrophils Relative %: 56.1 % (ref 43.0–77.0)
Platelets: 321 10*3/uL (ref 150.0–400.0)
RBC: 4.4 Mil/uL (ref 3.87–5.11)
RDW: 12.1 % (ref 11.5–15.5)
WBC: 4.4 10*3/uL (ref 4.0–10.5)

## 2023-01-23 MED ORDER — BENZONATATE 100 MG PO CAPS: 100.0000 mg | ORAL_CAPSULE | Freq: Three times a day (TID) | ORAL | 0 refills | Status: DC

## 2023-01-23 MED ORDER — AMOXICILLIN-POT CLAVULANATE 875-125 MG PO TABS: 1.0000 | ORAL_TABLET | Freq: Two times a day (BID) | ORAL | 0 refills | Status: DC

## 2023-01-23 NOTE — Progress Notes (Signed)
Sara Davidson , 05/18/1965, 57 y.o., female MRN: [id number] Patient Care Team    Relationship Specialty Notifications Start End  Natalia Leatherwood, DO PCP - General Family Medicine  03/04/21   Archer Asa, MD Consulting Physician Psychiatry  03/04/21   Dannielle Huh, MD Consulting Physician Orthopedic Surgery  03/04/21   Adelene Amas, [facility]  Behavioral Health  03/04/21   Annamaria Helling, MD Consulting Physician Obstetrics and Gynecology  03/04/21   Charlott Rakes, MD Consulting Physician Gastroenterology  03/04/21   Janet Berlin, MD Consulting Physician Ophthalmology  03/04/21     Chief Complaint  Patient presents with   Cough    C/o weak, loss of tate, congestion, light headed, recent COVID +     Subjective: Sara Davidson is a 57 y.o. Pt presents for an OV with complaints of continued COVID symptoms.  Patient was diagnosed with COVID 01/10/2023.  She reports she had seen some improvement in her symptoms, however she continues to have weakness, fatigue, lightheadedness, decreased appetite, cough, head and chest congestion.  She states the lightheadedness seems to be whenever she is up and moving around.  She denies fevers, chills, nausea or vomit.     01/23/2023   11:18 AM 03/04/2021    2:33 PM 12/21/2017   11:09 AM 12/12/2017   10:18 AM 05/21/2017   11:28 AM  Depression screen PHQ 2/9  Decreased Interest 0 0 0 0 0  Down, Depressed, Hopeless 0 0 0 0 0  PHQ - 2 Score 0 0 0 0 0  Altered sleeping 0      Tired, decreased energy 0      Change in appetite 0      Feeling bad or failure about yourself  0      Trouble concentrating 0      Moving slowly or fidgety/restless 0      Suicidal thoughts 0      PHQ-9 Score 0      Difficult doing work/chores Not difficult at all        Allergies  Allergen Reactions   Sulfa Antibiotics Hives   Latex Other (See Comments)   Social History   Social History Narrative   Marital status/children/pets: Married.  2 grown  children.   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:      -Wears a bicycle helmet riding a bike: Yes     -smoke alarm in the home:Yes     - wears seatbelt: Yes     - Feels safe in their relationships: Yes      Past Medical History:  Diagnosis Date   Anemia    Anxiety    Chicken pox    CIN I (cervical intraepithelial neoplasia I)    Contact lens/glasses fitting    wears contacts or glasses   Cyclical mastalgia    Depression    Headache    migraines   Papanicolaou smear of vagina with high grade squamous intraepithelial lesion (HGSIL)    Past Surgical History:  Procedure Laterality Date   BREAST BIOPSY Right 07/04/2012   Procedure: Right Breast Wire Localized Excision;  Surgeon: Emelia Loron, MD;  Location: [facility];  Service: General;  Laterality: Right;   CERVICAL CONIZATION W/BX  2004   FOOT BONE EXCISION Right 03/2013   RIGHT FOOT 5TH METATARSAL SURGERY WITH PIN   INGUINAL HERNIA REPAIR Right 2015   WITH MESH   KNEE ARTHROSCOPY W/ ACL RECONSTRUCTION  1308,6578   Left   LAPAROSCOPIC VAGINAL HYSTERECTOMY WITH SALPINGECTOMY Bilateral 09/26/2017   Procedure: LAPAROSCOPIC ASSISTED VAGINAL HYSTERECTOMY WITH SALPINGECTOMY;  Surgeon: Marlow Baars, MD;  Location: [facility];  Service: Gynecology;  Laterality: Bilateral;  DR Chestine Spore REQUESTING 3 HOURS OF OR TIME   LIPOMA EXCISION  2008   lt forearm   METATARSAL OSTEOTOMY     SHOULDER ARTHROSCOPY Left 01/2013   SHOULDER ARTHROSCOPY     UMBILICAL HERNIA REPAIR  2011   UMB   Family History  Problem Relation Age of Onset   Stroke Mother    Hyperlipidemia Mother    Arthritis Mother    Breast cancer Mother    Depression Father    Learning disabilities Father    Hearing loss Father    Early death Father    Bipolar disorder Father    Suicidality Father    Learning disabilities Daughter    Depression Daughter    Asthma Daughter    Depression Daughter    Arthritis  Maternal Grandmother    Heart attack Maternal Grandfather    Arthritis Paternal Grandmother    Heart attack Paternal Grandfather    Allergies as of 01/23/2023       Reactions   Sulfa Antibiotics Hives   Latex Other (See Comments)        Medication List        Accurate as of January 23, 2023 11:54 AM. If you have any questions, ask your nurse or doctor.          amoxicillin-clavulanate 875-125 MG tablet Commonly known as: AUGMENTIN Take 1 tablet by mouth 2 (two) times daily. Started by: Felix Pacini   B2 100 MG Tabs Take 200 mg by mouth every evening.   benzonatate 100 MG capsule Commonly known as: TESSALON Take 1 capsule (100 mg total) by mouth 3 (three) times daily.   buPROPion 150 MG 24 hr tablet Commonly known as: WELLBUTRIN XL Take 1 tablet (150 mg total) by mouth daily.   CALCIUM 1200 PO Take by mouth.   cetirizine 10 MG tablet Commonly known as: ZYRTEC Take 10 mg by mouth daily.   cholecalciferol 1000 units tablet Commonly known as: VITAMIN D Take 1,000 Units by mouth daily. Sublingual drops   COLLAGEN PO Take by mouth.   Estradiol 10 MCG Tabs vaginal tablet Commonly known as: Vagifem Place 1 tablet (10 mcg total) vaginally 2 (two) times a week. What changed: when to take this   estradiol 0.1 MG/GM vaginal cream Commonly known as: ESTRACE SMARTSIG:Gram(s) Topical 3 Times a Week What changed: Another medication with the same name was changed. Make sure you understand how and when to take each.   K2 PLUS D3 PO Take 1 Dose by mouth every Monday, Wednesday, and Friday. Vitamin d3 (1000 units)-Vitamin K2 (25 mcg) per dropperful Take 1 dropperful on Mondays, Wednesdays, & Fridays.   MAG-200 PO Take 200 mg by mouth every evening.   ondansetron 4 MG tablet Commonly known as: ZOFRAN Take 1 tablet (4 mg total) by mouth every 8 (eight) hours as needed for nausea or vomiting.   OVER THE COUNTER MEDICATION VisionMD softgels   PROBIOTIC-PREBIOTIC  PO Take by mouth.   rizatriptan 10 MG tablet Commonly known as: MAXALT Take 1 tablet (10 mg total) by mouth as needed for migraine. May repeat in 2 hours if needed   TURMERIC-GINGER PO Take by mouth.   VISION PLUS PO Take by mouth.        All past  medical history, surgical history, allergies, family history, immunizations andmedications were updated in the EMR today and reviewed under the history and medication portions of their EMR.     ROS Negative, with the exception of above mentioned in HPI   Objective:  BP 92/63   Pulse 75   Temp 98.1 F (36.7 C)   Wt 112 lb 12.8 oz (51.2 kg)   LMP 09/24/2017 (Exact Date)   SpO2 99%   BMI 18.77 kg/m  Body mass index is 18.77 kg/m. Physical Exam Vitals and nursing note reviewed.  Constitutional:      General: She is not in acute distress.    Appearance: Normal appearance. She is not ill-appearing or toxic-appearing.  HENT:     Head: Normocephalic and atraumatic.     Comments: Bilateral clear effusions present, no erythema of tympanic membrane.    Right Ear: Ear canal and external ear normal. There is no impacted cerumen.     Left Ear: Ear canal and external ear normal. There is no impacted cerumen.     Nose: Congestion and rhinorrhea present.     Comments: Moderate postnasal drip present    Mouth/Throat:     Mouth: Mucous membranes are moist.     Pharynx: No oropharyngeal exudate or posterior oropharyngeal erythema.  Eyes:     General: No scleral icterus.       Right eye: No discharge.        Left eye: No discharge.     Conjunctiva/sclera: Conjunctivae normal.  Cardiovascular:     Rate and Rhythm: Normal rate and regular rhythm.  Pulmonary:     Effort: Pulmonary effort is normal. No respiratory distress.     Breath sounds: No wheezing, rhonchi or rales.  Musculoskeletal:     Cervical back: Neck supple.     Right lower leg: No edema.     Left lower leg: No edema.  Lymphadenopathy:     Cervical: No cervical  adenopathy.  Skin:    Findings: No rash.  Neurological:     Mental Status: She is alert and oriented to person, place, and time. Mental status is at baseline.  Psychiatric:        Mood and Affect: Mood normal.        Behavior: Behavior normal.        Thought Content: Thought content normal.        Judgment: Judgment normal.      No results found. No results found. No results found for this or any previous visit (from the past 24 hour(s)).   Assessment/Plan: Nala Kachel is a 57 y.o. female present for OV for  COVID-19-persistent symptoms/cough Hydrate.  Rest mucinex (DM if cough), nettie pot or nasal saline.  CBC with differential, CMP collected today.  Rule out persistent infection and electrolyte imbalance secondary to COVID-19 with bacterial sinusitis infection thereafter. Start Augmentin twice daily Tessalon Perles prescribed if needed for continued cough Cough can last 4-6 weeks status post COVID, this does not mean infectious and does not need continuous treatment. Follow-up 2 weeks as needed  Reviewed expectations re: course of current medical issues. Discussed self-management of symptoms. Outlined signs and symptoms indicating need for more acute intervention. Patient verbalized understanding and all questions were answered. Patient received an After-Visit Summary.    Orders Placed This Encounter  Procedures   CBC w/Diff   Comp Met (CMET)   Meds ordered this encounter  Medications   amoxicillin-clavulanate (AUGMENTIN) 875-125 MG tablet    Sig:  Take 1 tablet by mouth 2 (two) times daily.    Dispense:  20 tablet    Refill:  0   benzonatate (TESSALON) 100 MG capsule    Sig: Take 1 capsule (100 mg total) by mouth 3 (three) times daily.    Dispense:  30 capsule    Refill:  0   Referral Orders  No referral(s) requested today     Note is dictated utilizing voice recognition software. Although note has been proof read prior to signing, occasional  typographical errors still can be missed. If any questions arise, please do not hesitate to call for verification.   electronically signed by:  Felix Pacini, DO  [facility] - [place]

## 2023-01-23 NOTE — Patient Instructions (Signed)

## 2023-01-24 LAB — COMPREHENSIVE METABOLIC PANEL
ALT: 9 U/L (ref 0–35)
AST: 14 U/L (ref 0–37)
Albumin: 4.4 g/dL (ref 3.5–5.2)
Alkaline Phosphatase: 46 U/L (ref 39–117)
BUN: 8 mg/dL (ref 6–23)
CO2: 28 meq/L (ref 19–32)
Calcium: 9.8 mg/dL (ref 8.4–10.5)
Chloride: 102 meq/L (ref 96–112)
Creatinine, Ser: 0.74 mg/dL (ref 0.40–1.20)
GFR: 90.09 mL/min (ref >60.00)
Glucose, Bld: 95 mg/dL (ref 70–99)
Potassium: 4.8 meq/L (ref 3.5–5.1)
Sodium: 139 meq/L (ref 135–145)
Total Bilirubin: 0.6 mg/dL (ref 0.2–1.2)
Total Protein: 6.2 g/dL (ref 6.0–8.3)

## 2023-01-25 ENCOUNTER — Encounter: Payer: Self-pay | Admitting: Family Medicine

## 2023-01-30 NOTE — Telephone Encounter (Signed)
Please draft a letter for Sara Davidson (and separate letter for Sara Davidson) of a  Physician's Statement on letterhead.  Patient seen in clinic 01/10/2023 and diagnosed with covid illness.  Patient continue to have elongated illness and was evaluated again 01/23/2023 and found to have complications secondary to COVID illness.  Patient was medically advised not to travel due to complications of COVID illness for the next 4 weeks.

## 2023-01-31 NOTE — Telephone Encounter (Signed)
Letter sent via My Chart.

## 2023-01-31 NOTE — Telephone Encounter (Signed)
No further action needed.

## 2023-02-01 ENCOUNTER — Ambulatory Visit: Payer: BC Managed Care – PPO | Admitting: Family Medicine

## 2023-02-07 ENCOUNTER — Ambulatory Visit: Payer: BC Managed Care – PPO | Admitting: Family Medicine

## 2023-02-07 ENCOUNTER — Encounter: Payer: Self-pay | Admitting: Family Medicine

## 2023-02-07 DIAGNOSIS — Z0279 Encounter for issue of other medical certificate: Secondary | ICD-10-CM

## 2023-02-09 NOTE — Telephone Encounter (Signed)
Completed patient's form and returned to CMA work basket.

## 2023-02-12 ENCOUNTER — Ambulatory Visit: Payer: BC Managed Care – PPO | Admitting: Family Medicine

## 2023-02-12 DIAGNOSIS — F4323 Adjustment disorder with mixed anxiety and depressed mood: Secondary | ICD-10-CM | POA: Diagnosis not present

## 2023-02-12 NOTE — Telephone Encounter (Signed)
No further action needed.

## 2023-02-26 ENCOUNTER — Ambulatory Visit: Payer: BC Managed Care – PPO | Admitting: Family Medicine

## 2023-02-26 ENCOUNTER — Encounter: Payer: Self-pay | Admitting: Family Medicine

## 2023-02-26 VITALS — BP 110/78 | HR 86 | Temp 98.1°F | Wt 113.4 lb

## 2023-02-26 DIAGNOSIS — F418 Other specified anxiety disorders: Secondary | ICD-10-CM | POA: Insufficient documentation

## 2023-02-26 DIAGNOSIS — E538 Deficiency of other specified B group vitamins: Secondary | ICD-10-CM | POA: Diagnosis not present

## 2023-02-26 DIAGNOSIS — E559 Vitamin D deficiency, unspecified: Secondary | ICD-10-CM | POA: Diagnosis not present

## 2023-02-26 DIAGNOSIS — Z23 Encounter for immunization: Secondary | ICD-10-CM

## 2023-02-26 DIAGNOSIS — G43009 Migraine without aura, not intractable, without status migrainosus: Secondary | ICD-10-CM

## 2023-02-26 DIAGNOSIS — M8589 Other specified disorders of bone density and structure, multiple sites: Secondary | ICD-10-CM | POA: Diagnosis not present

## 2023-02-26 MED ORDER — BUPROPION HCL ER (XL) 150 MG PO TB24: 150.0000 mg | ORAL_TABLET | Freq: Every day | ORAL | 1 refills | Status: DC

## 2023-02-26 MED ORDER — RIZATRIPTAN BENZOATE 10 MG PO TABS: 10.0000 mg | ORAL_TABLET | ORAL | 11 refills | Status: DC | PRN

## 2023-02-26 NOTE — Progress Notes (Signed)
Patient ID: Sara Davidson, female  DOB: [birth year], 57 y.o.   MRN: [id number] Patient Care Team    Relationship Specialty Notifications Start End  Natalia Leatherwood, DO PCP - General Family Medicine  03/04/21   Archer Asa, MD Consulting Physician Psychiatry  03/04/21   Dannielle Huh, MD Consulting Physician Orthopedic Surgery  03/04/21   Adelene Amas, [facility]  Behavioral Health  03/04/21   Annamaria Helling, MD Consulting Physician Obstetrics and Gynecology  03/04/21   Charlott Rakes, MD Consulting Physician Gastroenterology  03/04/21   Janet Berlin, MD Consulting Physician Ophthalmology  03/04/21     Chief Complaint  Patient presents with   Depression    Subjective: Sara Davidson is a 57 y.o.  Female  present for Chronic Conditions/illness Management All past medical history, surgical history, allergies, family history, immunizations, medications and social history were updated in the electronic medical record today. All recent labs, ED visits and hospitalizations within the last year were reviewed.  Depression: Patient has a history of depression and some mild anxiety per her report.  She is being treated with Wellbutrin 150 mg daily.  She is established with a therapist which she sees routinely and has a good relationship with.   She reports compliance with Wellbutrin 150 mg daily  Migraine: Patient reports she uses Maxalt for her migraines, and it is still working well for her She states her triggers typically are barometric pressure and different scents that are strong.       02/26/2023   10:11 AM 01/23/2023   11:18 AM 03/04/2021    2:33 PM 12/21/2017   11:09 AM 12/12/2017   10:18 AM  Depression screen PHQ 2/9  Decreased Interest 0 0 0 0 0  Down, Depressed, Hopeless 0 0 0 0 0  PHQ - 2 Score 0 0 0 0 0  Altered sleeping 0 0     Tired, decreased energy 3 0     Change in appetite 0 0     Feeling bad or failure about yourself  0 0     Trouble  concentrating 0 0     Moving slowly or fidgety/restless 0 0     Suicidal thoughts 0 0     PHQ-9 Score 3 0     Difficult doing work/chores Not difficult at all Not difficult at all          No data to display          Immunization History  Administered Date(s) Administered   Influenza, Seasonal, Injecte, Preservative Fre 02/26/2023   Influenza,inj,Quad PF,6+ Mos 01/25/2016   Influenza-Unspecified 02/10/2014, 01/15/2021   PFIZER(Purple Top)SARS-COV-2 Vaccination 05/31/2019, 06/24/2019, 08/03/2020   Pfizer Covid-19 Vaccine Bivalent Booster 34yrs & up 01/20/2021   Td 08/26/2020   Zoster Recombinant(Shingrix) 08/26/2020, 03/04/2021    Past Medical History:  Diagnosis Date   Anemia    Anxiety    Chicken pox    CIN I (cervical intraepithelial neoplasia I)    Contact lens/glasses fitting    wears contacts or glasses   COVID-19 01/23/2023   Cyclical mastalgia    Depression    Headache    migraines   Papanicolaou smear of vagina with high grade squamous intraepithelial lesion (HGSIL)    Rib pain 03/04/2021   Allergies  Allergen Reactions   Sulfa Antibiotics Hives   Latex Other (See Comments)   Past Surgical History:  Procedure Laterality Date   BREAST BIOPSY Right 07/04/2012   Procedure: Right Breast Wire Localized  Excision;  Surgeon: Emelia Loron, MD;  Location: [facility];  Service: General;  Laterality: Right;   CERVICAL CONIZATION W/BX  2004   FOOT BONE EXCISION Right 03/2013   RIGHT FOOT 5TH METATARSAL SURGERY WITH PIN   INGUINAL HERNIA REPAIR Right 2015   WITH MESH   KNEE ARTHROSCOPY W/ ACL RECONSTRUCTION  2952,8413   Left   LAPAROSCOPIC VAGINAL HYSTERECTOMY WITH SALPINGECTOMY Bilateral 09/26/2017   Procedure: LAPAROSCOPIC ASSISTED VAGINAL HYSTERECTOMY WITH SALPINGECTOMY;  Surgeon: Marlow Baars, MD;  Location: [facility];  Service: Gynecology;  Laterality: Bilateral;  DR Chestine Spore REQUESTING 3 HOURS OF OR TIME   LIPOMA EXCISION  2008   lt forearm    METATARSAL OSTEOTOMY     SHOULDER ARTHROSCOPY Left 01/2013   SHOULDER ARTHROSCOPY     UMBILICAL HERNIA REPAIR  2011   UMB   Family History  Problem Relation Age of Onset   Stroke Mother    Hyperlipidemia Mother    Arthritis Mother    Breast cancer Mother    Depression Father    Learning disabilities Father    Hearing loss Father    Early death Father    Bipolar disorder Father    Suicidality Father    Learning disabilities Daughter    Depression Daughter    Asthma Daughter    Depression Daughter    Arthritis Maternal Grandmother    Heart attack Maternal Grandfather    Arthritis Paternal Grandmother    Heart attack Paternal Grandfather    Social History   Social History Narrative   Marital status/children/pets: Married.  2 grown children.   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 If you have any questions, ask your nurse or doctor.          STOP taking these medications    amoxicillin-clavulanate 875-125 MG tablet Commonly known as: AUGMENTIN Stopped by: Felix Pacini   benzonatate 100 MG capsule Commonly known as: TESSALON Stopped by: Felix Pacini   ondansetron 4 MG tablet Commonly known as: ZOFRAN Stopped by: Felix Pacini       TAKE these medications    B2 100 MG Tabs Take 200 mg by mouth every evening.   buPROPion 150 MG 24 hr tablet Commonly known as: WELLBUTRIN XL Take 1 tablet (150 mg total) by mouth daily.   CALCIUM 1200 PO Take by mouth.   cetirizine 10 MG tablet Commonly known as: ZYRTEC Take 10 mg by mouth daily.   cholecalciferol 1000 units  tablet Commonly known as: VITAMIN D Take 1,000 Units by mouth daily. Sublingual drops   COLLAGEN PO Take by mouth.   Estradiol 10 MCG Tabs vaginal tablet Commonly known as: Vagifem Place 1 tablet (10 mcg total) vaginally 2 (two) times a week. What changed: when to take this   estradiol 0.1 MG/GM vaginal cream Commonly known as: ESTRACE SMARTSIG:Gram(s) Topical 3 Times a Week What changed: Another medication with the same name was  changed. Make sure you understand how and when to take each.   K2 PLUS D3 PO Take 1 Dose by mouth every Monday, Wednesday, and Friday. Vitamin d3 (1000 units)-Vitamin K2 (25 mcg) per dropperful Take 1 dropperful on Mondays, Wednesdays, & Fridays.   MAG-200 PO Take 200 mg by mouth every evening.   OVER THE COUNTER MEDICATION VisionMD softgels   PROBIOTIC-PREBIOTIC PO Take by mouth.   rizatriptan 10 MG tablet Commonly known as: MAXALT Take 1 tablet (10 mg total) by mouth as needed for migraine. May repeat in 2 hours if needed   TURMERIC-GINGER PO Take by mouth.   VISION PLUS PO Take by mouth.        All past medical history, surgical history, allergies, family history, immunizations andmedications were updated in the EMR today and reviewed under the history and medication portions of their EMR.      Review of Systems  Constitutional: Negative.   HENT: Negative.    Eyes: Negative.   Respiratory: Negative.    Cardiovascular: Negative.   Gastrointestinal: Negative.   Genitourinary: Negative.   Musculoskeletal: Negative.   Skin: Negative.   Neurological: Negative.   Endo/Heme/Allergies: Negative.   Psychiatric/Behavioral: Negative.    All other systems reviewed and are negative.  14 pt review of systems performed and negative (unless mentioned in an HPI)  Objective: BP 110/78   Pulse 86   Temp 98.1 F (36.7 C)   Wt 113 lb 6.4 oz (51.4 kg)   LMP 09/24/2017 (Exact Date)   SpO2 100%   BMI 18.87 kg/m  Physical Exam Vitals and  nursing note reviewed.  Constitutional:      General: She is not in acute distress.    Appearance: Normal appearance. She is not ill-appearing, toxic-appearing or diaphoretic.  HENT:     Head: Normocephalic and atraumatic.  Eyes:     General: No scleral icterus.       Right eye: No discharge.        Left eye: No discharge.     Extraocular Movements: Extraocular movements intact.     Conjunctiva/sclera: Conjunctivae normal.     Pupils: Pupils are equal, round, and reactive to light.  Cardiovascular:     Rate and Rhythm: Normal rate and regular rhythm.  Pulmonary:     Effort: Pulmonary effort is normal. No respiratory distress.     Breath sounds: Normal breath sounds. No wheezing, rhonchi or rales.  Musculoskeletal:     Right lower leg: No edema.     Left lower leg: No edema.  Skin:    General: Skin is warm.     Findings: No rash.  Neurological:     Mental Status: She is alert and oriented to person, place, and time. Mental status is at baseline.     Motor: No weakness.     Gait: Gait normal.  Psychiatric:        Mood and Affect: Mood normal.        Behavior: Behavior normal.        Thought Content: Thought content normal.        Judgment: Judgment normal.      No results found.  Assessment/plan: Sara Davidson is a 57 y.o. female present for chronic condition follow-up Migraine without aura and without status migrainosus, not intractable Stable Continue Maxalt as needed Magnesium up-to-date due next visit  Osteopenia of multiple sites Vitamin D up-to-date due next visit DEXA ordered for December  Vitamin B deficiency: Vitamin B levels up-to-date  due next visit Regularly scheduled B12 IM injection provided today.  Depression: Stable Continue Wellbutrin 150 mg daily. Continue routine therapy sessions with therapist.  Influenza vaccine updated today Return in about 25 weeks (around 08/20/2023) for cpe (20 min), Routine chronic condition follow-up.  Orders  Placed This Encounter  Procedures   DG Bone Density   Flu vaccine trivalent PF, 6mos and older(Flulaval,Afluria,Fluarix,Fluzone)    Meds ordered this encounter  Medications   buPROPion (WELLBUTRIN XL) 150 MG 24 hr tablet    Sig: Take 1 tablet (150 mg total) by mouth daily.    Dispense:  90 tablet    Refill:  1    Dc all prior scripts   rizatriptan (MAXALT) 10 MG tablet    Sig: Take 1 tablet (10 mg total) by mouth as needed for migraine. May repeat in 2 hours if needed    Dispense:  10 tablet    Refill:  11   Referral Orders  No referral(s) requested today     Electronically signed by: Felix Pacini, DO [facility] [place]

## 2023-02-26 NOTE — Patient Instructions (Addendum)
Return in about 25 weeks (around 08/20/2023) for cpe (20 min), Routine chronic condition follow-up.        Great to see you today.  I have refilled the medication(s) we provide.   If labs were collected or images ordered, we will inform you of  results once we have received them and reviewed. We will contact you either by echart message, or telephone call.  Please give ample time to the testing facility, and our office to run,  receive and review results. Please do not call inquiring of results, even if you can see them in your chart. We will contact you as soon as we are able. If it has been over 1 week since the test was completed, and you have not yet heard from [place], then please call us.    - echart message- for normal results that have been seen by the patient already.   - telephone call: abnormal results or if patient has not viewed results in their echart.  If a referral to a specialist was entered for you, please call us in 2 weeks if you have not heard from the specialist office to schedule.

## 2023-03-09 ENCOUNTER — Ambulatory Visit: Payer: BC Managed Care – PPO | Admitting: Sports Medicine

## 2023-03-09 VITALS — HR 86 | Ht 65.0 in | Wt 114.0 lb

## 2023-03-09 DIAGNOSIS — M7701 Medial epicondylitis, right elbow: Secondary | ICD-10-CM

## 2023-03-09 MED ORDER — MELOXICAM 15 MG PO TABS: 15.0000 mg | ORAL_TABLET | Freq: Every day | ORAL | 0 refills | Status: DC

## 2023-03-09 NOTE — Patient Instructions (Signed)
-   Start meloxicam 15 mg daily x2 weeks.  If still having pain after 2 weeks, complete 3rd-week of NSAID. May use remaining NSAID as needed once daily for pain control.  Do not to use additional over-the-counter NSAIDs (ibuprofen, naproxen, Advil, Aleve) while taking prescription NSAIDs.  May use Tylenol [id number] mg 2 to 3 times a day for breakthrough pain. Elbow HEP Recommend getting a tennis elbow strap  4 week follow up

## 2023-03-09 NOTE — Progress Notes (Signed)
Sara Davidson [facility] [address] [place] [address] Phone: [phone]   Assessment and Plan:     1. Medial epicondylitis of elbow, right -Chronic with exacerbation, initial sports medicine visit - Nearly 9 months of medial epicondyle pain worsening and progressing over the past several months.  Consistent with medial epicondylitis likely due to increased physical activity cleaning leaves, and deconditioning related to COVID - Start HEP for medial epicondylitis - Recommend using tennis elbow brace when physically active which can also help medial epicondyle pain - Start meloxicam 15 mg daily x2 weeks.  If still having pain after 2 weeks, complete 3rd-week of NSAID. May use remaining NSAID as needed once daily for pain control.  Do not to use additional over-the-counter NSAIDs (ibuprofen, naproxen, Advil, Aleve) while taking prescription NSAIDs.  May use Tylenol [phone] mg 2 to 3 times a day for breakthrough pain.    Pertinent previous records reviewed include none  Follow Up: 3 to 4 weeks for reevaluation.  If no improvement or worsening of symptoms, would obtain right elbow x-ray and could consider physical therapy versus needling versus ECSWT   Subjective:   I, Sara Davidson, am serving as a Neurosurgeon for Doctor Richardean Sale  Chief Complaint: right elbow pain   HPI:   03/09/23 Patient is a 56 year old female with concerns of right elbow pain. Patient states has had pain since September she has been picking up leaves and has been over doing it . Has been using her cell phone more as well. Medial elbow pain 6-8 months of bone pain. Ibu and voltaren gel doesn't help. Decreased ROM. Decreased grip strength when lifting . Is the main care giver for her husband   Relevant Historical Information: None pertinent  Additional pertinent review of systems negative.   Current Outpatient Medications:    Bacillus Coagulans-Inulin  (PROBIOTIC-PREBIOTIC PO), Take by mouth., Disp: , Rfl:    buPROPion (WELLBUTRIN XL) 150 MG 24 hr tablet, Take 1 tablet (150 mg total) by mouth daily., Disp: 90 tablet, Rfl: 1   Calcium Carbonate-Vit D-Min (CALCIUM 1200 PO), Take by mouth., Disp: , Rfl:    cetirizine (ZYRTEC) 10 MG tablet, Take 10 mg by mouth daily., Disp: , Rfl:    cholecalciferol (VITAMIN D) 1000 units tablet, Take 1,000 Units by mouth daily. Sublingual drops, Disp: , Rfl:    COLLAGEN PO, Take by mouth., Disp: , Rfl:    estradiol (ESTRACE) 0.1 MG/GM vaginal cream, SMARTSIG:Gram(s) Topical 3 Times a Week, Disp: , Rfl:    Estradiol (VAGIFEM) 10 MCG TABS vaginal tablet, Place 1 tablet (10 mcg total) vaginally 2 (two) times a week. (Patient taking differently: Place 1 tablet vaginally 3 (three) times a week.), Disp: 8 tablet, Rfl: 11   Magnesium Oxide (MAG-200 PO), Take 200 mg by mouth every evening., Disp: , Rfl:    meloxicam (MOBIC) 15 MG tablet, Take 1 tablet (15 mg total) by mouth daily., Disp: 30 tablet, Rfl: 0   Multiple Vitamins-Minerals (VISION PLUS PO), Take by mouth., Disp: , Rfl:    OVER THE COUNTER MEDICATION, VisionMD softgels, Disp: , Rfl:    Riboflavin (B2) 100 MG TABS, Take 200 mg by mouth every evening., Disp: , Rfl:    rizatriptan (MAXALT) 10 MG tablet, Take 1 tablet (10 mg total) by mouth as needed for migraine. May repeat in 2 hours if needed, Disp: 10 tablet, Rfl: 11   TURMERIC-GINGER PO, Take by mouth., Disp: , Rfl:  Vitamin D-Vitamin K (K2 PLUS D3 PO), Take 1 Dose by mouth every Monday, Wednesday, and Friday. Vitamin d3 (1000 units)-Vitamin K2 (25 mcg) per dropperful Take 1 dropperful on Mondays, Wednesdays, & Fridays., Disp: , Rfl:    Objective:     Vitals:   03/09/23 1114  Pulse: 86  SpO2: 100%  Weight: 114 lb (51.7 kg)  Height: 5\' 5"  (1.651 m)      Body mass index is 18.97 kg/m.    Physical Exam:    General: Appears well, no acute distress, nontoxic and pleasant Neck: FROM, no pain Neuro:  sensation is intact distally with no deficits, strenghth is 5/5 in elbow flexors/extenders/supinator/pronators and wrist flexors/extensors Psych: no evidence of anxiety or depression  Right elbow: No deformity, swelling or muscle wasting Normal Carrying angle ROM:0-140, supination and pronation 90 TTP significantly over medial epicondyle, pronator, mildly over lateral epicondyle NTTP over triceps, ticeps tendon, olecronon,  , antecubital fossa, biceps tendon, supinator,   Negative tinnels over cubital tunnel No pain with resisted wrist and middle digit extension   pain with resisted wrist flexion No pain with resisted supination   pain with resisted pronation Negative valgus stress Negative varus stress Negative milking maneuver    Electronically signed by:  Sara Davidson [facility] 12:17 PM 03/09/23

## 2023-03-20 DIAGNOSIS — H04123 Dry eye syndrome of bilateral lacrimal glands: Secondary | ICD-10-CM | POA: Diagnosis not present

## 2023-03-20 DIAGNOSIS — H2513 Age-related nuclear cataract, bilateral: Secondary | ICD-10-CM | POA: Diagnosis not present

## 2023-03-20 DIAGNOSIS — H5213 Myopia, bilateral: Secondary | ICD-10-CM | POA: Diagnosis not present

## 2023-03-20 DIAGNOSIS — H524 Presbyopia: Secondary | ICD-10-CM | POA: Diagnosis not present

## 2023-03-23 DIAGNOSIS — F4323 Adjustment disorder with mixed anxiety and depressed mood: Secondary | ICD-10-CM | POA: Diagnosis not present

## 2023-04-05 ENCOUNTER — Ambulatory Visit: Payer: BC Managed Care – PPO | Admitting: Sports Medicine

## 2023-04-05 ENCOUNTER — Ambulatory Visit (INDEPENDENT_AMBULATORY_CARE_PROVIDER_SITE_OTHER): Payer: BC Managed Care – PPO

## 2023-04-05 ENCOUNTER — Encounter: Payer: Self-pay | Admitting: Family Medicine

## 2023-04-05 VITALS — BP 120/78 | HR 81 | Ht 65.0 in | Wt 116.0 lb

## 2023-04-05 DIAGNOSIS — F4323 Adjustment disorder with mixed anxiety and depressed mood: Secondary | ICD-10-CM | POA: Diagnosis not present

## 2023-04-05 DIAGNOSIS — M7701 Medial epicondylitis, right elbow: Secondary | ICD-10-CM | POA: Diagnosis not present

## 2023-04-05 DIAGNOSIS — M25521 Pain in right elbow: Secondary | ICD-10-CM | POA: Diagnosis not present

## 2023-04-05 DIAGNOSIS — M19021 Primary osteoarthritis, right elbow: Secondary | ICD-10-CM | POA: Diagnosis not present

## 2023-04-05 MED ORDER — MELOXICAM 15 MG PO TABS: 15.0000 mg | ORAL_TABLET | Freq: Every day | ORAL | 0 refills | Status: DC | PRN

## 2023-04-05 NOTE — Progress Notes (Deleted)
    Sara Davidson [facility] [address] [place] [address] Phone: [phone]   Assessment and Plan:     There are no diagnoses linked to this encounter.  ***   Pertinent previous records reviewed include ***    Follow Up: ***     Subjective:   I, Sara Davidson, am serving as a Neurosurgeon for Sara Davidson   Chief Complaint: right elbow pain    HPI:    03/09/23 Patient is a 57 year old female with concerns of right elbow pain. Patient states has had pain since September she has been picking up leaves and has been over doing it . Has been using her cell phone more as well. Medial elbow pain 6-8 months of bone pain. Ibu and voltaren gel doesn't help. Decreased ROM. Decreased grip strength when lifting . Is the main care giver for her husband   04/06/2023 Patient states  Relevant Historical Information: None pertinent Additional pertinent review of systems negative.   Current Outpatient Medications:    Bacillus Coagulans-Inulin (PROBIOTIC-PREBIOTIC PO), Take by mouth., Disp: , Rfl:    buPROPion (WELLBUTRIN XL) 150 MG 24 hr tablet, Take 1 tablet (150 mg total) by mouth daily., Disp: 90 tablet, Rfl: 1   Calcium Carbonate-Vit D-Min (CALCIUM 1200 PO), Take by mouth., Disp: , Rfl:    cetirizine (ZYRTEC) 10 MG tablet, Take 10 mg by mouth daily., Disp: , Rfl:    cholecalciferol (VITAMIN D) 1000 units tablet, Take 1,000 Units by mouth daily. Sublingual drops, Disp: , Rfl:    COLLAGEN PO, Take by mouth., Disp: , Rfl:    estradiol (ESTRACE) 0.1 MG/GM vaginal cream, SMARTSIG:Gram(s) Topical 3 Times a Week, Disp: , Rfl:    Estradiol (VAGIFEM) 10 MCG TABS vaginal tablet, Place 1 tablet (10 mcg total) vaginally 2 (two) times a week. (Patient taking differently: Place 1 tablet vaginally 3 (three) times a week.), Disp: 8 tablet, Rfl: 11   Magnesium Oxide (MAG-200 PO), Take 200 mg by mouth every evening., Disp: , Rfl:    meloxicam (MOBIC) 15  MG tablet, Take 1 tablet (15 mg total) by mouth daily., Disp: 30 tablet, Rfl: 0   Multiple Vitamins-Minerals (VISION PLUS PO), Take by mouth., Disp: , Rfl:    OVER THE COUNTER MEDICATION, VisionMD softgels, Disp: , Rfl:    Riboflavin (B2) 100 MG TABS, Take 200 mg by mouth every evening., Disp: , Rfl:    rizatriptan (MAXALT) 10 MG tablet, Take 1 tablet (10 mg total) by mouth as needed for migraine. May repeat in 2 hours if needed, Disp: 10 tablet, Rfl: 11   TURMERIC-GINGER PO, Take by mouth., Disp: , Rfl:    Vitamin D-Vitamin K (K2 PLUS D3 PO), Take 1 Dose by mouth every Monday, Wednesday, and Friday. Vitamin d3 (1000 units)-Vitamin K2 (25 mcg) per dropperful Take 1 dropperful on Mondays, Wednesdays, & Fridays., Disp: , Rfl:    Objective:     There were no vitals filed for this visit.    There is no height or weight on file to calculate BMI.    Physical Exam:    ***   Electronically signed by:  Sara Davidson [facility] 7:37 AM 04/05/23

## 2023-04-05 NOTE — Patient Instructions (Signed)
Tylenol [phone] mg 2-3 times a day for pain relief  Discontinue meloxicam and use meloxicam 15 mg as needed no more than 1-2 times per week  Pt referral  Voltaren gel over areas of pain 1-2 times per day  Use wrist brace 4-6 week follow up

## 2023-04-05 NOTE — Progress Notes (Signed)
Aleen Sells [facility] [address] [place] [address] Phone: [phone]   Assessment and Plan:     1. Medial epicondylitis of elbow, right -Chronic with exacerbation, subsequent visit - Overall improvement in medial epicondylitis since completing course of meloxicam 15 mg daily, starting HEP, however patient continues to have recurrent flares - X-ray obtained in clinic.  My interpretation: No acute fracture or dislocation.  Mild degenerative changes - Start Tylenol 500 to 1000 mg tablets 2-3 times a day for day-to-day pain relief - Discontinue meloxicam 15 mg daily.  May continue meloxicam 15 mg daily as needed for breakthrough pain.  Recommend limiting chronic NSAIDs to 1-2 doses per week - Continue HEP and start physical therapy.  Referral sent - Recommend continuing to use golfers elbow strap, and start using wrist brace to decrease strain over medial elbow - Offered prednisone Dosepak, however patient does not like the way that prednisone makes her feel, so no prescription today   Pertinent previous records reviewed include none  Follow Up: 4 to 6 weeks for reevaluation.  If no improvement or worsening of symptoms, could further discuss PRP versus ECSWT versus needling   Subjective:   I, Moenique Parris, am serving as a Neurosurgeon for Doctor Richardean Sale   Chief Complaint: right elbow pain    HPI:    03/09/23 Patient is a 57 year old female with concerns of right elbow pain. Patient states has had pain since September she has been picking up leaves and has been over doing it . Has been using her cell phone more as well. Medial elbow pain 6-8 months of bone pain. Ibu and voltaren gel doesn't help. Decreased ROM. Decreased grip strength when lifting . Is the main care giver for her husband   04/05/2023 Patient states was doing really well but tweaked it Tuesday decorating for christmas .    Relevant Historical Information: None  pertinent Additional pertinent review of systems negative.   Current Outpatient Medications:    Bacillus Coagulans-Inulin (PROBIOTIC-PREBIOTIC PO), Take by mouth., Disp: , Rfl:    buPROPion (WELLBUTRIN XL) 150 MG 24 hr tablet, Take 1 tablet (150 mg total) by mouth daily., Disp: 90 tablet, Rfl: 1   Calcium Carbonate-Vit D-Min (CALCIUM 1200 PO), Take by mouth., Disp: , Rfl:    cetirizine (ZYRTEC) 10 MG tablet, Take 10 mg by mouth daily., Disp: , Rfl:    cholecalciferol (VITAMIN D) 1000 units tablet, Take 1,000 Units by mouth daily. Sublingual drops, Disp: , Rfl:    COLLAGEN PO, Take by mouth., Disp: , Rfl:    estradiol (ESTRACE) 0.1 MG/GM vaginal cream, SMARTSIG:Gram(s) Topical 3 Times a Week, Disp: , Rfl:    Estradiol (VAGIFEM) 10 MCG TABS vaginal tablet, Place 1 tablet (10 mcg total) vaginally 2 (two) times a week. (Patient taking differently: Place 1 tablet vaginally 3 (three) times a week.), Disp: 8 tablet, Rfl: 11   Magnesium Oxide (MAG-200 PO), Take 200 mg by mouth every evening., Disp: , Rfl:    Multiple Vitamins-Minerals (VISION PLUS PO), Take by mouth., Disp: , Rfl:    OVER THE COUNTER MEDICATION, VisionMD softgels, Disp: , Rfl:    Riboflavin (B2) 100 MG TABS, Take 200 mg by mouth every evening., Disp: , Rfl:    rizatriptan (MAXALT) 10 MG tablet, Take 1 tablet (10 mg total) by mouth as needed for migraine. May repeat in 2 hours if needed, Disp: 10 tablet, Rfl: 11   TURMERIC-GINGER PO, Take by  mouth., Disp: , Rfl:    Vitamin D-Vitamin K (K2 PLUS D3 PO), Take 1 Dose by mouth every Monday, Wednesday, and Friday. Vitamin d3 (1000 units)-Vitamin K2 (25 mcg) per dropperful Take 1 dropperful on Mondays, Wednesdays, & Fridays., Disp: , Rfl:    meloxicam (MOBIC) 15 MG tablet, Take 1 tablet (15 mg total) by mouth daily as needed for pain., Disp: 30 tablet, Rfl: 0   Objective:     Vitals:   04/05/23 1258  BP: 120/78  Pulse: 81  SpO2: 99%  Weight: 116 lb (52.6 kg)  Height: 5\' 5"  (1.651 m)       Body mass index is 19.3 kg/m.    Physical Exam:    General: Appears well, no acute distress, nontoxic and pleasant Neck: FROM, no pain Neuro: sensation is intact distally with no deficits, strenghth is 5/5 in elbow flexors/extenders/supinator/pronators and wrist flexors/extensors Psych: no evidence of anxiety or depression   Right elbow: No deformity, swelling or muscle wasting Normal Carrying angle ROM:0-140, supination and pronation 90 TTP significantly over medial epicondyle, pronator, mildly over lateral epicondyle NTTP over triceps, ticeps tendon, olecronon,  , antecubital fossa, biceps tendon, supinator,   Negative tinnels over cubital tunnel No pain with resisted wrist and middle digit extension Mild pain with resisted wrist flexion No pain with resisted supination Significant pain with resisted pronation Negative valgus stress Negative varus stress Negative milking maneuver     Electronically signed by:  Aleen Sells [facility] 1:51 PM 04/05/23

## 2023-04-06 ENCOUNTER — Ambulatory Visit: Payer: BC Managed Care – PPO | Admitting: Sports Medicine

## 2023-04-17 ENCOUNTER — Other Ambulatory Visit: Payer: Self-pay

## 2023-04-17 ENCOUNTER — Encounter: Payer: Self-pay | Admitting: Physical Therapy

## 2023-04-17 ENCOUNTER — Ambulatory Visit: Payer: BC Managed Care – PPO | Attending: Sports Medicine | Admitting: Physical Therapy

## 2023-04-17 DIAGNOSIS — M25521 Pain in right elbow: Secondary | ICD-10-CM | POA: Diagnosis not present

## 2023-04-17 DIAGNOSIS — M7701 Medial epicondylitis, right elbow: Secondary | ICD-10-CM | POA: Diagnosis not present

## 2023-04-17 DIAGNOSIS — M6281 Muscle weakness (generalized): Secondary | ICD-10-CM | POA: Diagnosis not present

## 2023-04-17 NOTE — Therapy (Signed)
 OUTPATIENT PHYSICAL THERAPY SHOULDER EVALUATION   Patient Name: Sara Davidson MRN: [id number] DOB:[birth year], 57 y.o., female Today's Date: 04/17/2023   PT End of Session - 04/17/23 1123     Visit Number 1    Number of Visits --   1-2x/week   Date for PT Re-Evaluation 06/12/23    Authorization Type [employer] - FOTO    PT Start Time 1000    PT Stop Time 1040    PT Time Calculation (min) 40 min             Past Medical History:  Diagnosis Date   Anemia    Anxiety    Chicken pox    CIN I (cervical intraepithelial neoplasia I)    Contact lens/glasses fitting    wears contacts or glasses   COVID-19 01/23/2023   Cyclical mastalgia    Depression    Headache    migraines   Papanicolaou smear of vagina with high grade squamous intraepithelial lesion (HGSIL)    Rib pain 03/04/2021   Past Surgical History:  Procedure Laterality Date   BREAST BIOPSY Right 07/04/2012   Procedure: Right Breast Wire Localized Excision;  Surgeon: Sara Bury, MD;  Location: [facility];  Service: General;  Laterality: Right;   CERVICAL CONIZATION W/BX  2004   FOOT BONE EXCISION Right 03/2013   RIGHT FOOT 5TH METATARSAL SURGERY WITH PIN   INGUINAL HERNIA REPAIR Right 2015   WITH MESH   KNEE ARTHROSCOPY W/ ACL RECONSTRUCTION  [id number]   Left   LAPAROSCOPIC VAGINAL HYSTERECTOMY WITH SALPINGECTOMY Bilateral 09/26/2017   Procedure: LAPAROSCOPIC ASSISTED VAGINAL HYSTERECTOMY WITH SALPINGECTOMY;  Surgeon: Sara Gums, MD;  Location: [facility];  Service: Gynecology;  Laterality: Bilateral;  DR Sara REQUESTING 3 HOURS OF OR TIME   LIPOMA EXCISION  2008   lt forearm   METATARSAL OSTEOTOMY     SHOULDER ARTHROSCOPY Left 01/2013   SHOULDER ARTHROSCOPY     UMBILICAL HERNIA REPAIR  2011   UMB   Patient Active Problem List   Diagnosis Date Noted   Depression with anxiety 02/26/2023   Vitamin D  deficiency 08/05/2021   B12 deficiency 08/05/2021   Osteopenia of multiple sites  03/25/2021   History of repair of ACL 03/09/2021   Migraine 03/04/2021   Postmenopausal 03/04/2021   Atrophic vaginitis 03/04/2021   Estrogen deficiency 03/04/2021   Chronic pain of left knee 03/04/2021    PCP: Sara Davidson LABOR, DO  REFERRING PROVIDER: Leonce Katz, DO  THERAPY DIAG:  Pain in right elbow  Muscle weakness  REFERRING DIAG: Medial epicondylitis of elbow, right [M77.01]   Rationale for Evaluation and Treatment:  Rehabilitation  SUBJECTIVE:  PERTINENT PAST HISTORY:  Osteopenia, L knee pain        PRECAUTIONS: None  WEIGHT BEARING RESTRICTIONS No  FALLS:  Has patient fallen in last 6 months? No, Number of falls: 0  MOI/History of condition:  Onset date: 1 year  SUBJECTIVE STATEMENT  Sara Davidson is a 57 y.o. female who presents to clinic with chief complaint of medial elbow pain.  She had some mildly irritating medial elbow pain starting about 1 year ago with no trauma noted.  She had an acute exacerbation after she did a significant amount of yard work in September which involved rolling up bags of leaves.  After the yard work her pain has been much worse. She now has almost constant pain with any activity.  She has completed some exercises at home which were minimally helpful.  She does wear a wrist brace to limit flexion which is somewhat helpful.   Red flags:  denies   Pain:  Are you having pain? Yes Pain location: R medial elbow NPRS scale:  0/10 to 9/10 Aggravating factors: lifting, using the R hand Relieving factors: rest Pain description: intermittent, dull, and aching Stage: Chronic 24 hour pattern: NA   Occupation: Nutritional Therapist: NA  Hand Dominance: R  Patient Goals/Specific Activities: Reduce pain, return to exercise   OBJECTIVE:   DIAGNOSTIC FINDINGS:  X-ray, no significant findings noted  GENERAL OBSERVATION: AN     SENSATION: Light touch: Appears intact   PALPATION: TTP medial epicondyle    UPPER EXTREMITY AROM:  ROM Right (Eval) Left (Eval)  Shoulder flexion n n  Shoulder abduction n n  Shoulder internal rotation    Shoulder external rotation    Functional IR    Functional ER    Shoulder extension    Elbow extension    Elbow flexion     (Blank rows = not tested, N = WNL, * = concordant pain with testing)  UPPER EXTREMITY MMT:  MMT Right (Eval) Left (Eval)  Shoulder flexion    Shoulder abduction (C5)    Shoulder ER    Shoulder IR    Elbow flexion 3+* 4+  Elbow ext n n  Shoulder extension    Grip strength 60 70  Cervical flexion (C1,C2)    Cervical S/B (C3)    Wrist Ext n n  Ulnar deviation n n  Radial deviation 4* n  Wrist flexion 3* n  Finger abd (T1)    Grossly     (Blank rows = not tested, score listed is out of 5 possible points.  N = WNL, D = diminished, C = clear for gross weakness with myotome testing, * = concordant pain with testing)  UPPER EXTREMITY PROM:  PROM Right (Eval) Left (Eval)  Shoulder flexion    Shoulder abduction    Shoulder internal rotation    Shoulder external rotation    Functional IR    Functional ER    Shoulder extension    Elbow extension    Elbow flexion     (Blank rows = not tested, N = WNL, * = concordant pain with testing)  SPECIAL TESTS: NT  JOINT MOBILITY TESTING:  NA  PATIENT SURVEYS:  FOTO 55 -> 68    TODAY'S TREATMENT:  Therapeutic Exercise: Creating, reviewing, and completing below HEP   PATIENT EDUCATION:  POC, diagnosis, prognosis, HEP, and outcome measures.  Pt educated via explanation, demonstration, and handout (HEP).  Pt confirms understanding verbally.   HOME EXERCISE PROGRAM: Access Code: [email or web address] URL: [email or web address] Date: 04/17/2023 Prepared by: Sara Davidson  Exercises - Seated Isometric Wrist Flexion Supinated with Manual Resistance  - 1-2 x daily - 7 x weekly - 1 sets - 3 reps - 1 minute hold  Treatment priorities   Eval         Progressive loading starting with isometrics                                          ASSESSMENT:  CLINICAL IMPRESSION: Sara Davidson is a 57 y.o. female who presents to clinic with signs and sxs consistent with physician impression of medial epicondylalgia.  She reports having some mild to moderate chronic medial elbow pain which was exacerbated with significant increase in  activity.  She will benefit from skilled PT to address relevant deficits and return to PLOF including exercise and yardwork.  OBJECTIVE IMPAIRMENTS: Pain, wrist flexion and grip strength  ACTIVITY LIMITATIONS: lifting, housework, yardwork, gripping, object manipulation  PERSONAL FACTORS: See medical history and pertinent history   REHAB POTENTIAL: Good  CLINICAL DECISION MAKING: Stable/uncomplicated  EVALUATION COMPLEXITY: Low   GOALS:   SHORT TERM GOALS: Target date: 05/15/2023   Arika will be >75% HEP compliant to improve carryover between sessions and facilitate independent management of condition  Evaluation: ongoing Goal status: INITIAL   LONG TERM GOALS: Target date: 06/12/2023   Virdell will improve FOTO score to 68 as a proxy for functional improvement  Evaluation/Baseline: 55 Goal status: INITIAL    2.  Tuwanna will self report >/= 50% decrease in pain from evaluation to improve function in daily tasks  Evaluation/Baseline: 9/10 max pain Goal status: INITIAL   3.  Dailin will be able to resume yoga and light weight lifting, not limited by pain  Evaluation/Baseline: limited Goal status: INITIAL   4.  Nazarene will be able to complete yard work and house work, not limited by pain  Evaluation/Baseline: limited Goal status: INITIAL   5.  Aubrianna will achieve 70 lbs of right grip strength  Evaluation/Baseline: L 70 lbs, R 60 lbs Goal status: INITIAL   6.  Kambree will report confidence in self management of condition at time of discharge with advanced  HEP  Evaluation/Baseline: unable to self manage Goal status: INITIAL    PLAN: PT FREQUENCY: 1-2x/week  PT DURATION: 8 weeks  PLANNED INTERVENTIONS:  [id number]- PT Re-evaluation, 97110-Therapeutic exercises, 97530- Therapeutic activity, 97112- Neuromuscular re-education, 97535- Self Care, [id number]- Manual therapy, [id number]- Gait training, [id number]- Aquatic Therapy, [phone]- Electrical stimulation (manual), [id number]- Vasopneumatic device, [id number]- Traction (mechanical), [id number]- Ionotophoresis 4mg /ml Dexamethasone , Taping, Dry Needling, Joint manipulation, and Spinal manipulation.   Yong Wahlquist PT, DPT 04/17/2023, 11:24 AM

## 2023-04-17 NOTE — Addendum Note (Signed)
Addended by: Fredderick Phenix on: 04/17/2023 11:26 AM   Modules accepted: Orders

## 2023-04-23 ENCOUNTER — Encounter: Payer: Self-pay | Admitting: Sports Medicine

## 2023-04-23 ENCOUNTER — Other Ambulatory Visit: Payer: Self-pay | Admitting: Sports Medicine

## 2023-04-23 DIAGNOSIS — M7701 Medial epicondylitis, right elbow: Secondary | ICD-10-CM

## 2023-04-23 NOTE — Progress Notes (Signed)
 please order right elbow MRI without contrast for chronic right elbow pain, medial epicondylitis to [facility].  I messaged patient already, so she is aware of treatment plan.  Thank you.

## 2023-04-25 ENCOUNTER — Ambulatory Visit: Payer: BC Managed Care – PPO

## 2023-04-26 DIAGNOSIS — F4323 Adjustment disorder with mixed anxiety and depressed mood: Secondary | ICD-10-CM | POA: Diagnosis not present

## 2023-04-28 ENCOUNTER — Other Ambulatory Visit: Payer: BC Managed Care – PPO

## 2023-05-01 ENCOUNTER — Ambulatory Visit (INDEPENDENT_AMBULATORY_CARE_PROVIDER_SITE_OTHER): Payer: BC Managed Care – PPO

## 2023-05-01 DIAGNOSIS — M7701 Medial epicondylitis, right elbow: Secondary | ICD-10-CM

## 2023-05-01 DIAGNOSIS — M948X2 Other specified disorders of cartilage, upper arm: Secondary | ICD-10-CM | POA: Diagnosis not present

## 2023-05-01 DIAGNOSIS — M25421 Effusion, right elbow: Secondary | ICD-10-CM | POA: Diagnosis not present

## 2023-05-01 DIAGNOSIS — M778 Other enthesopathies, not elsewhere classified: Secondary | ICD-10-CM | POA: Diagnosis not present

## 2023-05-02 ENCOUNTER — Ambulatory Visit: Payer: BC Managed Care – PPO | Admitting: Physical Therapy

## 2023-05-03 ENCOUNTER — Ambulatory Visit: Payer: BC Managed Care – PPO | Admitting: Sports Medicine

## 2023-05-04 NOTE — Progress Notes (Unsigned)
    Sara Davidson D.[facility] [address] [place] [address] Phone: [phone]   Assessment and Plan:     There are no diagnoses linked to this encounter.  ***   Pertinent previous records reviewed include ***    Follow Up: ***     Subjective:   I, Sara Davidson, am serving as a Neurosurgeon for Doctor Richardean Sale   Chief Complaint: right elbow pain    HPI:    03/09/23 Patient is a 58 year old female with concerns of right elbow pain. Patient states has had pain since September she has been picking up leaves and has been over doing it . Has been using her cell phone more as well. Medial elbow pain 6-8 months of bone pain. Ibu and voltaren gel doesn't help. Decreased ROM. Decreased grip strength when lifting . 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   05/07/2023 Patient states   Relevant Historical Information: None pertinent  Additional pertinent review of systems negative.   Current Outpatient Medications:    Bacillus Coagulans-Inulin (PROBIOTIC-PREBIOTIC PO), Take by mouth., Disp: , Rfl:    buPROPion (WELLBUTRIN XL) 150 MG 24 hr tablet, Take 1 tablet (150 mg total) by mouth daily., Disp: 90 tablet, Rfl: 1   Calcium Carbonate-Vit D-Min (CALCIUM 1200 PO), Take by mouth., Disp: , Rfl:    cetirizine (ZYRTEC) 10 MG tablet, Take 10 mg by mouth daily., Disp: , Rfl:    cholecalciferol (VITAMIN D) 1000 units tablet, Take 1,000 Units by mouth daily. Sublingual drops, Disp: , Rfl:    COLLAGEN PO, Take by mouth., Disp: , Rfl:    estradiol (ESTRACE) 0.1 MG/GM vaginal cream, SMARTSIG:Gram(s) Topical 3 Times a Week, Disp: , Rfl:    Estradiol (VAGIFEM) 10 MCG TABS vaginal tablet, Place 1 tablet (10 mcg total) vaginally 2 (two) times a week. (Patient taking differently: Place 1 tablet vaginally 3 (three) times a week.), Disp: 8 tablet, Rfl: 11    Magnesium Oxide (MAG-200 PO), Take 200 mg by mouth every evening., Disp: , Rfl:    meloxicam (MOBIC) 15 MG tablet, Take 1 tablet (15 mg total) by mouth daily as needed for pain., Disp: 30 tablet, Rfl: 0   Multiple Vitamins-Minerals (VISION PLUS PO), Take by mouth., Disp: , Rfl:    OVER THE COUNTER MEDICATION, VisionMD softgels, Disp: , Rfl:    Riboflavin (B2) 100 MG TABS, Take 200 mg by mouth every evening., Disp: , Rfl:    rizatriptan (MAXALT) 10 MG tablet, Take 1 tablet (10 mg total) by mouth as needed for migraine. May repeat in 2 hours if needed, Disp: 10 tablet, Rfl: 11   TURMERIC-GINGER PO, Take by mouth., Disp: , Rfl:    Vitamin D-Vitamin K (K2 PLUS D3 PO), Take 1 Dose by mouth every Monday, Wednesday, and Friday. Vitamin d3 (1000 units)-Vitamin K2 (25 mcg) per dropperful Take 1 dropperful on Mondays, Wednesdays, & Fridays., Disp: , Rfl:    Objective:     There were no vitals filed for this visit.    There is no height or weight on file to calculate BMI.    Physical Exam:    ***   Electronically signed by:  Sara Davidson [facility] 7:22 AM 05/04/23

## 2023-05-07 ENCOUNTER — Telehealth: Payer: Self-pay | Admitting: Sports Medicine

## 2023-05-07 ENCOUNTER — Ambulatory Visit: Payer: BC Managed Care – PPO | Admitting: Sports Medicine

## 2023-05-07 VITALS — HR 87 | Ht 65.0 in | Wt 116.0 lb

## 2023-05-07 DIAGNOSIS — M7701 Medial epicondylitis, right elbow: Secondary | ICD-10-CM | POA: Diagnosis not present

## 2023-05-07 DIAGNOSIS — M8430XA Stress fracture, unspecified site, initial encounter for fracture: Secondary | ICD-10-CM | POA: Diagnosis not present

## 2023-05-07 NOTE — Telephone Encounter (Signed)
Based on her appt today and her MRI results, pt wonders if she should continue or cancel the Physical Therapy?

## 2023-05-07 NOTE — Patient Instructions (Signed)
No lifting more than 5-10 pounds for 4 weeks, if still painful wait an additional 2 week , and if still painful wait another 2 weeks As needed follow up , if no improvement 8 week follow up

## 2023-05-07 NOTE — Telephone Encounter (Signed)
Called and spoke with patient.

## 2023-05-09 ENCOUNTER — Ambulatory Visit: Payer: BC Managed Care – PPO

## 2023-05-14 DIAGNOSIS — F4323 Adjustment disorder with mixed anxiety and depressed mood: Secondary | ICD-10-CM | POA: Diagnosis not present

## 2023-05-16 ENCOUNTER — Encounter: Payer: BC Managed Care – PPO | Admitting: Physical Therapy

## 2023-05-17 ENCOUNTER — Ambulatory Visit: Payer: BC Managed Care – PPO | Admitting: Sports Medicine

## 2023-05-30 ENCOUNTER — Encounter: Payer: BC Managed Care – PPO | Admitting: Physical Therapy

## 2023-05-31 ENCOUNTER — Ambulatory Visit (HOSPITAL_BASED_OUTPATIENT_CLINIC_OR_DEPARTMENT_OTHER): Payer: BC Managed Care – PPO

## 2023-06-07 ENCOUNTER — Ambulatory Visit (HOSPITAL_BASED_OUTPATIENT_CLINIC_OR_DEPARTMENT_OTHER)
Admission: RE | Admit: 2023-06-07 | Discharge: 2023-06-07 | Disposition: A | Discharge status: Home / Self Care | Payer: BC Managed Care – PPO | Source: Ambulatory Visit | Attending: Family Medicine | Admitting: Family Medicine

## 2023-06-07 ENCOUNTER — Encounter: Payer: Self-pay | Admitting: Family Medicine

## 2023-06-07 DIAGNOSIS — M8589 Other specified disorders of bone density and structure, multiple sites: Secondary | ICD-10-CM | POA: Diagnosis not present

## 2023-06-07 DIAGNOSIS — M85852 Other specified disorders of bone density and structure, left thigh: Secondary | ICD-10-CM | POA: Diagnosis not present

## 2023-06-07 DIAGNOSIS — M85851 Other specified disorders of bone density and structure, right thigh: Secondary | ICD-10-CM | POA: Diagnosis not present

## 2023-06-07 DIAGNOSIS — Z780 Asymptomatic menopausal state: Secondary | ICD-10-CM | POA: Diagnosis not present

## 2023-06-20 DIAGNOSIS — N760 Acute vaginitis: Secondary | ICD-10-CM | POA: Diagnosis not present

## 2023-06-20 DIAGNOSIS — N898 Other specified noninflammatory disorders of vagina: Secondary | ICD-10-CM | POA: Diagnosis not present

## 2023-06-29 DIAGNOSIS — F4323 Adjustment disorder with mixed anxiety and depressed mood: Secondary | ICD-10-CM | POA: Diagnosis not present

## 2023-07-10 DIAGNOSIS — F4323 Adjustment disorder with mixed anxiety and depressed mood: Secondary | ICD-10-CM | POA: Diagnosis not present

## 2023-07-19 DIAGNOSIS — Z01419 Encounter for gynecological examination (general) (routine) without abnormal findings: Secondary | ICD-10-CM | POA: Diagnosis not present

## 2023-07-19 DIAGNOSIS — Z1231 Encounter for screening mammogram for malignant neoplasm of breast: Secondary | ICD-10-CM | POA: Diagnosis not present

## 2023-07-19 LAB — HM MAMMOGRAPHY

## 2023-07-26 DIAGNOSIS — F4323 Adjustment disorder with mixed anxiety and depressed mood: Secondary | ICD-10-CM | POA: Diagnosis not present

## 2023-07-30 NOTE — Progress Notes (Unsigned)
 Aleen Sells D.[facility] [address] [place] [address] Phone: [phone]   Assessment and Plan:     There are no diagnoses linked to this encounter.  ***   Pertinent previous records reviewed include ***    Follow Up: ***     Subjective:   I, Sara Davidson, am serving as a Neurosurgeon for Doctor Richardean Sale   Chief Complaint: right elbow pain    HPI:    03/09/23 Patient is a 58 year old female with concerns of right elbow pain. Patient states has had pain since September she has been picking up leaves and has been over doing it . Has been using her cell phone more as well. Medial elbow pain 6-8 months of bone pain. Ibu and voltaren gel doesn't help. Decreased ROM. Decreased grip strength when lifting . 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    05/07/2023 Patient states here for MRI results . Elbow was feeling better but went for MRI and flared her pain   07/31/2023 Patient states   Relevant Historical Information: None pertinent  Additional pertinent review of systems negative.   Current Outpatient Medications:    Bacillus Coagulans-Inulin (PROBIOTIC-PREBIOTIC PO), Take by mouth., Disp: , Rfl:    buPROPion (WELLBUTRIN XL) 150 MG 24 hr tablet, Take 1 tablet (150 mg total) by mouth daily., Disp: 90 tablet, Rfl: 1   Calcium Carbonate-Vit D-Min (CALCIUM 1200 PO), Take by mouth., Disp: , Rfl:    cetirizine (ZYRTEC) 10 MG tablet, Take 10 mg by mouth daily., Disp: , Rfl:    cholecalciferol (VITAMIN D) 1000 units tablet, Take 1,000 Units by mouth daily. Sublingual drops, Disp: , Rfl:    COLLAGEN PO, Take by mouth., Disp: , Rfl:    estradiol (ESTRACE) 0.1 MG/GM vaginal cream, SMARTSIG:Gram(s) Topical 3 Times a Week, Disp: , Rfl:    Estradiol (VAGIFEM) 10 MCG TABS vaginal tablet, Place 1 tablet (10 mcg total) vaginally 2 (two)  times a week. (Patient taking differently: Place 1 tablet vaginally 3 (three) times a week.), Disp: 8 tablet, Rfl: 11   Magnesium Oxide (MAG-200 PO), Take 200 mg by mouth every evening., Disp: , Rfl:    meloxicam (MOBIC) 15 MG tablet, Take 1 tablet (15 mg total) by mouth daily as needed for pain., Disp: 30 tablet, Rfl: 0   Multiple Vitamins-Minerals (VISION PLUS PO), Take by mouth., Disp: , Rfl:    OVER THE COUNTER MEDICATION, VisionMD softgels, Disp: , Rfl:    Riboflavin (B2) 100 MG TABS, Take 200 mg by mouth every evening., Disp: , Rfl:    rizatriptan (MAXALT) 10 MG tablet, Take 1 tablet (10 mg total) by mouth as needed for migraine. May repeat in 2 hours if needed, Disp: 10 tablet, Rfl: 11   TURMERIC-GINGER PO, Take by mouth., Disp: , Rfl:    Vitamin D-Vitamin K (K2 PLUS D3 PO), Take 1 Dose by mouth every Monday, Wednesday, and Friday. Vitamin d3 (1000 units)-Vitamin K2 (25 mcg) per dropperful Take 1 dropperful on Mondays, Wednesdays, & Fridays., Disp: , Rfl:    Objective:     There were no vitals filed for this visit.    There is no height or weight on file to calculate BMI.    Physical Exam:    ***   Electronically signed by:  Aleen Sells D.[facility] 7:29  AM 07/30/23

## 2023-07-31 ENCOUNTER — Ambulatory Visit (INDEPENDENT_AMBULATORY_CARE_PROVIDER_SITE_OTHER)

## 2023-07-31 ENCOUNTER — Ambulatory Visit: Admitting: Sports Medicine

## 2023-07-31 VITALS — BP 122/82 | HR 88 | Ht 65.0 in | Wt 120.0 lb

## 2023-07-31 DIAGNOSIS — M19021 Primary osteoarthritis, right elbow: Secondary | ICD-10-CM | POA: Diagnosis not present

## 2023-07-31 DIAGNOSIS — M25521 Pain in right elbow: Secondary | ICD-10-CM | POA: Diagnosis not present

## 2023-07-31 DIAGNOSIS — M8430XA Stress fracture, unspecified site, initial encounter for fracture: Secondary | ICD-10-CM | POA: Diagnosis not present

## 2023-07-31 DIAGNOSIS — M7701 Medial epicondylitis, right elbow: Secondary | ICD-10-CM

## 2023-07-31 MED ORDER — CELECOXIB 200 MG PO CAPS: 200.0000 mg | ORAL_CAPSULE | Freq: Two times a day (BID) | ORAL | 0 refills | Status: DC

## 2023-07-31 NOTE — Patient Instructions (Addendum)
-   Start celebrex 200mg  2x daily x2 weeks.  If still having pain after 2 weeks, complete 3rd-week of NSAID. May use remaining NSAID as needed once daily for pain control.  Do not to use additional over-the-counter NSAIDs (ibuprofen, naproxen, Advil, Aleve, etc.) while taking prescription NSAIDs.  May use Tylenol [phone] mg 2 to 3 times a day for breakthrough pain. Celebrex 200mg  2x a day 60 tabs no refills [facility] PT referral to [facility] Right elbow xray Wear wrist brace during the day as much as possible See me in 4 weeks

## 2023-08-06 ENCOUNTER — Encounter: Payer: Self-pay | Admitting: Sports Medicine

## 2023-08-07 DIAGNOSIS — F4323 Adjustment disorder with mixed anxiety and depressed mood: Secondary | ICD-10-CM | POA: Diagnosis not present

## 2023-08-23 DIAGNOSIS — F4323 Adjustment disorder with mixed anxiety and depressed mood: Secondary | ICD-10-CM | POA: Diagnosis not present

## 2023-08-28 DIAGNOSIS — F4323 Adjustment disorder with mixed anxiety and depressed mood: Secondary | ICD-10-CM | POA: Diagnosis not present

## 2023-08-30 ENCOUNTER — Ambulatory Visit: Admitting: Sports Medicine

## 2023-08-31 ENCOUNTER — Ambulatory Visit (INDEPENDENT_AMBULATORY_CARE_PROVIDER_SITE_OTHER): Payer: BC Managed Care – PPO

## 2023-10-12 ENCOUNTER — Ambulatory Visit (INDEPENDENT_AMBULATORY_CARE_PROVIDER_SITE_OTHER)

## 2023-10-17 DIAGNOSIS — F4323 Adjustment disorder with mixed anxiety and depressed mood: Secondary | ICD-10-CM | POA: Diagnosis not present

## 2023-10-25 DIAGNOSIS — H04123 Dry eye syndrome of bilateral lacrimal glands: Secondary | ICD-10-CM | POA: Diagnosis not present

## 2023-10-25 DIAGNOSIS — H2513 Age-related nuclear cataract, bilateral: Secondary | ICD-10-CM | POA: Diagnosis not present

## 2023-11-13 DIAGNOSIS — F4323 Adjustment disorder with mixed anxiety and depressed mood: Secondary | ICD-10-CM | POA: Diagnosis not present

## 2023-11-26 DIAGNOSIS — F4323 Adjustment disorder with mixed anxiety and depressed mood: Secondary | ICD-10-CM | POA: Diagnosis not present

## 2023-12-03 ENCOUNTER — Other Ambulatory Visit: Payer: Self-pay | Admitting: Family Medicine

## 2023-12-03 MED ORDER — BUPROPION HCL ER (XL) 150 MG PO TB24: 150.0000 mg | ORAL_TABLET | Freq: Every day | ORAL | 0 refills | Status: DC

## 2023-12-03 NOTE — Telephone Encounter (Signed)
 Copied from CRM #[id number]. Topic: Clinical - Medication Refill >> Dec 03, 2023 11:35 AM Rosina BIRCH wrote: Medication: buPROPion  (WELLBUTRIN  XL) 150 MG 24 hr tablet  Has the patient contacted their pharmacy? No (Agent: If no, request that the patient contact the pharmacy for the refill. If patient does not wish to contact the pharmacy document the reason why and proceed with request.) (Agent: If yes, when and what did the pharmacy advise?)  This is the patient's preferred pharmacy:  [id number] [address] [facility] - [address] [place] [address] Phone: [phone] Fax: [phone]  Is this the correct pharmacy for this prescription? Yes If no, delete pharmacy and type the correct one.   Has the prescription been filled recently? No  Is the patient out of the medication? No  Has the patient been seen for an appointment in the last year OR does the patient have an upcoming appointment? Yes  Can we respond through MyChart? Yes  Agent: Please be advised that Rx refills may take up to 3 business days. We ask that you follow-up with your pharmacy.

## 2023-12-07 ENCOUNTER — Other Ambulatory Visit: Payer: Self-pay | Admitting: Family Medicine

## 2023-12-07 ENCOUNTER — Ambulatory Visit: Payer: Self-pay

## 2023-12-07 ENCOUNTER — Ambulatory Visit (INDEPENDENT_AMBULATORY_CARE_PROVIDER_SITE_OTHER): Admitting: Family Medicine

## 2023-12-07 DIAGNOSIS — Z91199 Patient's noncompliance with other medical treatment and regimen due to unspecified reason: Secondary | ICD-10-CM

## 2023-12-07 DIAGNOSIS — N952 Postmenopausal atrophic vaginitis: Secondary | ICD-10-CM

## 2023-12-07 DIAGNOSIS — E538 Deficiency of other specified B group vitamins: Secondary | ICD-10-CM

## 2023-12-07 DIAGNOSIS — G43009 Migraine without aura, not intractable, without status migrainosus: Secondary | ICD-10-CM

## 2023-12-07 DIAGNOSIS — E2839 Other primary ovarian failure: Secondary | ICD-10-CM

## 2023-12-07 DIAGNOSIS — M8589 Other specified disorders of bone density and structure, multiple sites: Secondary | ICD-10-CM

## 2023-12-07 DIAGNOSIS — F418 Other specified anxiety disorders: Secondary | ICD-10-CM

## 2023-12-07 MED ORDER — BUPROPION HCL ER (XL) 150 MG PO TB24: 150.0000 mg | ORAL_TABLET | Freq: Every day | ORAL | 0 refills | Status: DC

## 2023-12-07 NOTE — Patient Instructions (Signed)

## 2023-12-07 NOTE — Telephone Encounter (Signed)
 Temp Rx sent

## 2023-12-07 NOTE — Progress Notes (Signed)
   Same day cancel

## 2023-12-07 NOTE — Telephone Encounter (Signed)
 Copied from CRM [id number]. Topic: Clinical - Medication Refill >> Dec 07, 2023  8:51 AM Rosina BIRCH wrote: Medication: wellbutrin   Has the patient contacted their pharmacy? Yes (Agent: If no, request that the patient contact the pharmacy for the refill. If patient does not wish to contact the pharmacy document the reason why and proceed with request.) (Agent: If yes, when and what did the pharmacy advise?)  This is the patient's preferred pharmacy:  [id number] [address] [facility] - [address] [place] [address] Phone: [phone] Fax: [phone]  Is this the correct pharmacy for this prescription? Yes If no, delete pharmacy and type the correct one.   Has the prescription been filled recently? Yes  Is the patient out of the medication? Yes  Has the patient been seen for an appointment in the last year OR does the patient have an upcoming appointment? Yes  Can we respond through MyChart? Yes  Agent: Please be advised that Rx refills may take up to 3 business days. We ask that you follow-up with your pharmacy.

## 2023-12-07 NOTE — Telephone Encounter (Signed)
 FYI Only or Action Required?: Action required by provider: canceled apt today due to migraine-wanting her Wellbutrin  to get refilled.  Patient was last seen in primary care on 02/26/2023 by Catherine Fuller A, DO.  Called Nurse Triage reporting Migraine.  Symptoms began yesterday.  Interventions attempted: Prescription medications: Mazalt and Rest, hydration, or home remedies.  Symptoms are: unchanged.  Triage Disposition: See HCP Within 4 Hours (Or PCP Triage)-canceled apt today due to migraine.  Patient/caregiver understands and will follow disposition?: No, wishes to speak with PCP  Copied from CRM [id number]. Topic: Clinical - Red Word Triage >> Dec 07, 2023  8:53 AM Rosina BIRCH wrote: Red Word that prompted transfer to Nurse Triage: migraine symptoms, pain, body ache, nausea and light sensitivity Reason for Disposition  [1] SEVERE headache (e.g., excruciating) AND [2] not improved after 2 hours of pain medicine  Answer Assessment - Initial Assessment Questions 1. LOCATION: Where does it hurt?      All over her head 2. ONSET: When did the headache start? (e.g., minutes, hours, days)      Started last night 3. PATTERN: Does the pain come and go, or has it been constant since it started?     constant 4. SEVERITY: How bad is the pain? and What does it keep you from doing?  (e.g., Scale 1-10; mild, moderate, or severe)     severe 5. RECURRENT SYMPTOM: Have you ever had headaches before? If Yes, ask: When was the last time? and What happened that time?      yes 6. CAUSE: What do you think is causing the headache?     migraine 7. MIGRAINE: Have you been diagnosed with migraine headaches? If Yes, ask: Is this headache similar?      Yes-patient states this is a migraine 8. HEAD INJURY: Has there been any recent injury to your head?      no 9. OTHER SYMPTOMS: Do you have any other symptoms? (e.g., fever, stiff neck, eye pain, sore throat, cold symptoms)     Body  aches, nausea, light sensitivity, sound sensitivity-all typical migraine symptoms per patient.   Patient was scheduled for an appointment today and had to cancel due to a severe migraine. Patient had made the appointment to get her Wellbutrin  refilled. Patient was transferred over to Nurse Triage because of migraine. Patient reports she is in a lot of pain-has taken her migraine medication at this time. Patient is requesting to get her Wellbutrin  refilled because she will run out within a day. Patient would like a call back about her Wellbutrin   Protocols used: Headache-A-AH

## 2023-12-14 ENCOUNTER — Ambulatory Visit (INDEPENDENT_AMBULATORY_CARE_PROVIDER_SITE_OTHER): Admitting: Audiology

## 2023-12-14 ENCOUNTER — Ambulatory Visit (INDEPENDENT_AMBULATORY_CARE_PROVIDER_SITE_OTHER): Admitting: Otolaryngology

## 2023-12-18 ENCOUNTER — Ambulatory Visit: Admitting: Family Medicine

## 2023-12-18 DIAGNOSIS — F4323 Adjustment disorder with mixed anxiety and depressed mood: Secondary | ICD-10-CM | POA: Diagnosis not present

## 2023-12-26 ENCOUNTER — Encounter: Payer: Self-pay | Admitting: Family Medicine

## 2023-12-27 MED ORDER — MNEXSPIKE 10 MCG/0.2ML IM SUSY: 10.0000 ug | PREFILLED_SYRINGE | Freq: Once | INTRAMUSCULAR | 0 refills | Status: AC

## 2023-12-27 NOTE — Telephone Encounter (Signed)
 COVID-vaccine sent to pharmacy.

## 2024-01-07 DIAGNOSIS — F4323 Adjustment disorder with mixed anxiety and depressed mood: Secondary | ICD-10-CM | POA: Diagnosis not present

## 2024-01-09 ENCOUNTER — Encounter: Payer: Self-pay | Admitting: Family Medicine

## 2024-01-09 ENCOUNTER — Ambulatory Visit: Admitting: Family Medicine

## 2024-01-09 ENCOUNTER — Ambulatory Visit: Payer: Self-pay | Admitting: Family Medicine

## 2024-01-09 VITALS — BP 118/80 | HR 69 | Temp 98.2°F | Wt 116.6 lb

## 2024-01-09 DIAGNOSIS — E538 Deficiency of other specified B group vitamins: Secondary | ICD-10-CM | POA: Diagnosis not present

## 2024-01-09 DIAGNOSIS — F418 Other specified anxiety disorders: Secondary | ICD-10-CM | POA: Diagnosis not present

## 2024-01-09 DIAGNOSIS — R058 Other specified cough: Secondary | ICD-10-CM

## 2024-01-09 DIAGNOSIS — G43709 Chronic migraine without aura, not intractable, without status migrainosus: Secondary | ICD-10-CM

## 2024-01-09 DIAGNOSIS — H833X3 Noise effects on inner ear, bilateral: Secondary | ICD-10-CM

## 2024-01-09 DIAGNOSIS — L299 Pruritus, unspecified: Secondary | ICD-10-CM

## 2024-01-09 DIAGNOSIS — R5383 Other fatigue: Secondary | ICD-10-CM | POA: Diagnosis not present

## 2024-01-09 DIAGNOSIS — E559 Vitamin D deficiency, unspecified: Secondary | ICD-10-CM | POA: Diagnosis not present

## 2024-01-09 DIAGNOSIS — R41840 Attention and concentration deficit: Secondary | ICD-10-CM

## 2024-01-09 DIAGNOSIS — Z609 Problem related to social environment, unspecified: Secondary | ICD-10-CM

## 2024-01-09 DIAGNOSIS — F439 Reaction to severe stress, unspecified: Secondary | ICD-10-CM

## 2024-01-09 DIAGNOSIS — H53149 Visual discomfort, unspecified: Secondary | ICD-10-CM

## 2024-01-09 LAB — B12 AND FOLATE PANEL
Folate: >22.9 ng/mL (ref >5.9)
Vitamin B-12: >1500 pg/mL — ABNORMAL HIGH (ref 211–911)

## 2024-01-09 LAB — COMPREHENSIVE METABOLIC PANEL WITH GFR
ALT: 12 U/L (ref 0–35)
AST: 16 U/L (ref 0–37)
Albumin: 4.5 g/dL (ref 3.5–5.2)
Alkaline Phosphatase: 42 U/L (ref 39–117)
BUN: 9 mg/dL (ref 6–23)
CO2: 32 meq/L (ref 19–32)
Calcium: 10.1 mg/dL (ref 8.4–10.5)
Chloride: 101 meq/L (ref 96–112)
Creatinine, Ser: 0.72 mg/dL (ref 0.40–1.20)
GFR: 92.47 mL/min (ref >60.00)
Glucose, Bld: 68 mg/dL — ABNORMAL LOW (ref 70–99)
Potassium: 4.1 meq/L (ref 3.5–5.1)
Sodium: 141 meq/L (ref 135–145)
Total Bilirubin: 0.5 mg/dL (ref 0.2–1.2)
Total Protein: 6.5 g/dL (ref 6.0–8.3)

## 2024-01-09 LAB — TSH: TSH: 3.01 u[IU]/mL (ref 0.35–5.50)

## 2024-01-09 LAB — VITAMIN D 25 HYDROXY (VIT D DEFICIENCY, FRACTURES): VITD: 85.67 ng/mL (ref 30.00–100.00)

## 2024-01-09 MED ORDER — BUPROPION HCL ER (XL) 150 MG PO TB24: 150.0000 mg | ORAL_TABLET | Freq: Every day | ORAL | 1 refills | Status: AC

## 2024-01-09 MED ORDER — RIZATRIPTAN BENZOATE 10 MG PO TABS: 10.0000 mg | ORAL_TABLET | ORAL | 11 refills | Status: AC | PRN

## 2024-01-09 NOTE — Patient Instructions (Addendum)
 Return in about 24 weeks (around 06/25/2024) for Routine chronic condition follow-up.  physical schedule at your earliest convenience       Great to see you today.  I have refilled the medication(s) we provide.   If labs were collected or images ordered, we will inform you of  results once we have received them and reviewed. We will contact you either by echart message, or telephone call.  Please give ample time to the testing facility, and our office to run,  receive and review results. Please do not call inquiring of results, even if you can see them in your chart. We will contact you as soon as we are able. If it has been over 1 week since the test was completed, and you have not yet heard from us , then please call us .    - echart message- for normal results that have been seen by the patient already.   - telephone call: abnormal results or if patient has not viewed results in their echart.  If a referral to a specialist was entered for you, please call us  in 2 weeks if you have not heard from the specialist office to schedule.

## 2024-01-09 NOTE — Progress Notes (Unsigned)
 Patient ID: Sara Davidson, female  DOB: [birth year], 58 y.o.   MRN: [id number] Patient Care Team    Relationship Specialty Notifications Start End  Catherine Sara LABOR, DO PCP - General Family Medicine  03/04/21   Tasia Lung, MD Consulting Physician Psychiatry  03/04/21   Rubie Kemps, MD Consulting Physician Orthopedic Surgery  03/04/21   Veryl Handing, [facility]  Behavioral Health  03/04/21   Rox Charleston, MD Consulting Physician Obstetrics and Gynecology  03/04/21   Dianna Specking, MD Consulting Physician Gastroenterology  03/04/21   Patrcia Sharper, MD Consulting Physician Ophthalmology  03/04/21     Chief Complaint  Patient presents with   Depression    Subjective: Sara Davidson is a 58 y.o.  Female  present for Chronic Conditions/illness Management All past medical history, surgical history, allergies, family history, immunizations, medications and social history were updated in the electronic medical record today. All recent labs, ED visits and hospitalizations within the last year were reviewed.  Depression: Patient has a history of depression and some mild anxiety per her report.   She reports compliance with  Wellbutrin  150 mg daily.   She is established with a therapist which she sees routinely and has a good relationship with.     Migraine: Patient reports she uses Maxalt  for her migraines, and it is still working well for her She states her triggers typically are barometric pressure and different scents that are strong.       02/26/2023   10:11 AM 01/23/2023   11:18 AM 03/04/2021    2:33 PM 12/21/2017   11:09 AM 12/12/2017   10:18 AM  Depression screen PHQ 2/9  Decreased Interest 0 0 0 0 0  Down, Depressed, Hopeless 0 0 0 0 0  PHQ - 2 Score 0 0 0 0 0  Altered sleeping 0 0     Tired, decreased energy 3 0     Change in appetite 0 0     Feeling bad or failure about yourself  0 0     Trouble concentrating 0 0     Moving slowly or  fidgety/restless 0 0     Suicidal thoughts 0 0     PHQ-9 Score 3 0     Difficult doing work/chores Not difficult at all Not difficult at all         05/21/2017   11:28 AM 12/12/2017   10:18 AM 12/21/2017   11:09 AM 02/06/2020    9:11 AM 01/23/2023   11:19 AM  Fall Risk  Falls in the past year? No  No  No   0  Was there an injury with Fall?     0  Fall Risk Category Calculator     0  (RETIRED) Patient Fall Risk Level    Low fall risk    Patient at Risk for Falls Due to     No Fall Risks  Fall risk Follow up     Falls evaluation completed     Data saved with a previous flowsheet row definition        No data to display          Immunization History  Administered Date(s) Administered   INFLUENZA, HIGH DOSE SEASONAL PF 12/19/2023   Influenza, Seasonal, Injecte, Preservative Fre 02/26/2023   Influenza,inj,Quad PF,6+ Mos 01/25/2016   Influenza-Unspecified 02/10/2014, 01/15/2021   PFIZER(Purple Top)SARS-COV-2 Vaccination 05/31/2019, 06/24/2019, 08/03/2020   Pfizer Covid-19 Vaccine Bivalent Booster 19yrs & up 01/20/2021   Pfizer(Comirnaty)Fall Seasonal  Vaccine 12 years and older 01/06/2024   Td 08/26/2020   Zoster Recombinant(Shingrix) 08/26/2020, 03/04/2021    Past Medical History:  Diagnosis Date   Anemia    Anxiety    Chicken pox    CIN I (cervical intraepithelial neoplasia I)    Contact lens/glasses fitting    wears contacts or glasses   COVID-19 01/23/2023   Cyclical mastalgia    Depression    Headache    migraines   Papanicolaou smear of vagina with high grade squamous intraepithelial lesion (HGSIL)    Rib pain 03/04/2021   Allergies  Allergen Reactions   Sulfa Antibiotics Hives   Latex Other (See Comments)   Past Surgical History:  Procedure Laterality Date   BREAST BIOPSY Right 07/04/2012   Procedure: Right Breast Wire Localized Excision;  Surgeon: Donnice Bury, MD;  Location: [facility];  Service: General;  Laterality: Right;    CERVICAL CONIZATION W/BX  2004   FOOT BONE EXCISION Right 03/2013   RIGHT FOOT 5TH METATARSAL SURGERY WITH PIN   INGUINAL HERNIA REPAIR Right 2015   WITH MESH   KNEE ARTHROSCOPY W/ ACL RECONSTRUCTION  [id number]   Left   LAPAROSCOPIC VAGINAL HYSTERECTOMY WITH SALPINGECTOMY Bilateral 09/26/2017   Procedure: LAPAROSCOPIC ASSISTED VAGINAL HYSTERECTOMY WITH SALPINGECTOMY;  Surgeon: Gretta Gums, MD;  Location: [facility];  Service: Gynecology;  Laterality: Bilateral;  DR GRETTA REQUESTING 3 HOURS OF OR TIME   LIPOMA EXCISION  2008   lt forearm   METATARSAL OSTEOTOMY     SHOULDER ARTHROSCOPY Left 01/2013   SHOULDER ARTHROSCOPY     TOTAL LAPAROSCOPIC HYSTERECTOMY WITH SALPINGECTOMY Bilateral 09/26/2017   Procedure: LAPAROSCOPIC ASSISTED VAGINAL HYSTERECTOMY WITH SALPINGECTOMY; Surgeon: Gretta Gums, MD; Location: [facility]; Service: Gynecology; Laterality: Bilateral; DR GRETTA REQUESTING 3 HOURS OF OR TIME   UMBILICAL HERNIA REPAIR  2011   UMB   Family History  Problem Relation Age of Onset   Stroke Mother    Hyperlipidemia Mother    Arthritis Mother    Breast cancer Mother    Depression Father    Learning disabilities Father    Hearing loss Father    Early death Father    Bipolar disorder Father    Suicidality Father    Learning disabilities Daughter    Depression Daughter    Asthma Daughter    Depression Daughter    Arthritis Maternal Grandmother    Heart attack Maternal Grandfather    Arthritis Paternal Grandmother    Heart attack Paternal Grandfather    Social History   Social History Narrative   Marital status/children/pets: Married.  2 grown children.   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:      -Wears a bicycle helmet riding a bike: Yes     -smoke alarm in the home:Yes     - wears seatbelt: Yes     - Feels safe in their relationships: Yes       Allergies as of 01/09/2024       Reactions   Sulfa Antibiotics Hives   Latex  Other (See Comments)        Medication List        Accurate as of January 09, 2024  9:17 AM. If you have any questions, ask your nurse or doctor.          STOP taking these medications    Estradiol  10 MCG Tabs vaginal tablet Commonly known as: Vagifem  Stopped by: Sara Davidson  TAKE these medications    B2 100 MG Tabs Take 200 mg by mouth every evening.   buPROPion  150 MG 24 hr tablet Commonly known as: WELLBUTRIN  XL Take 1 tablet (150 mg total) by mouth daily.   CALCIUM 1200 PO Take by mouth.   cetirizine 10 MG tablet Commonly known as: ZYRTEC Take 10 mg by mouth daily.   cholecalciferol 1000 units tablet Commonly known as: VITAMIN D  Take 1,000 Units by mouth daily. Sublingual drops   COLLAGEN PO Take by mouth.   estradiol  1 MG tablet Commonly known as: ESTRACE  Take 1 mg by mouth daily.   K2 PLUS D3 PO Take 1 Dose by mouth every Monday, Wednesday, and Friday. Vitamin d3 (1000 units)-Vitamin K2 (25 mcg) per dropperful Take 1 dropperful on Mondays, Wednesdays, & Fridays.   MAG-200 PO Take 200 mg by mouth every evening.   OVER THE COUNTER MEDICATION VisionMD softgels   PROBIOTIC-PREBIOTIC PO Take by mouth.   rizatriptan  10 MG tablet Commonly known as: MAXALT  Take 1 tablet (10 mg total) by mouth as needed for migraine. May repeat in 2 hours if needed   TURMERIC-GINGER PO Take by mouth.        All past medical history, surgical history, allergies, family history, immunizations andmedications were updated in the EMR today and reviewed under the history and medication portions of their EMR.      Review of Systems  Constitutional: Negative.   HENT: Negative.    Eyes: Negative.   Respiratory: Negative.    Cardiovascular: Negative.   Gastrointestinal: Negative.   Genitourinary: Negative.   Musculoskeletal: Negative.   Skin: Negative.   Neurological: Negative.   Endo/Heme/Allergies: Negative.   Psychiatric/Behavioral: Negative.     All other systems reviewed and are negative.  14 pt review of systems performed and negative (unless mentioned in an HPI)  Objective: BP 118/80   Pulse 69   Temp 98.2 F (36.8 C)   Wt 116 lb 9.6 oz (52.9 kg)   LMP 09/24/2017 (Exact Date)   SpO2 99%   BMI 19.40 kg/m  Physical Exam Vitals and nursing note reviewed.  Constitutional:      General: She is not in acute distress.    Appearance: Normal appearance. She is not ill-appearing, toxic-appearing or diaphoretic.  HENT:     Head: Normocephalic and atraumatic.  Eyes:     General: No scleral icterus.       Right eye: No discharge.        Left eye: No discharge.     Extraocular Movements: Extraocular movements intact.     Conjunctiva/sclera: Conjunctivae normal.     Pupils: Pupils are equal, round, and reactive to light.  Cardiovascular:     Rate and Rhythm: Normal rate and regular rhythm.  Pulmonary:     Effort: Pulmonary effort is normal. No respiratory distress.     Breath sounds: Normal breath sounds. No wheezing, rhonchi or rales.  Musculoskeletal:     Right lower leg: No edema.     Left lower leg: No edema.  Skin:    General: Skin is warm.     Findings: No rash.  Neurological:     Mental Status: She is alert and oriented to person, place, and time. Mental status is at baseline.     Motor: No weakness.     Gait: Gait normal.  Psychiatric:        Mood and Affect: Mood normal.        Behavior: Behavior normal.  Thought Content: Thought content normal.        Judgment: Judgment normal.      No results found.  Assessment/plan: Sara Davidson is a 58 y.o. female present for chronic condition follow-up Migraine without aura and without status migrainosus, not intractable Stable Continue Maxalt  as needed  Osteopenia of multiple sites Vitamin D  2000u daily average DEXA UTD  05/2023 T score: -1.3 Calcium sup a little over 1200 mg per day Cmp collected today  Vitamin B deficiency: stable B12  subL daily 1000 mcg average B12/folate levels collected today  Depression: Stable Continue  Wellbutrin  150 mg daily. Continue routine therapy sessions with therapist.    No follow-ups on file.  No orders of the defined types were placed in this encounter.   No orders of the defined types were placed in this encounter.  Referral Orders  No referral(s) requested today     Electronically signed by: Sara Bellini, DO [facility]

## 2024-01-10 DIAGNOSIS — L299 Pruritus, unspecified: Secondary | ICD-10-CM | POA: Insufficient documentation

## 2024-01-10 DIAGNOSIS — F439 Reaction to severe stress, unspecified: Secondary | ICD-10-CM | POA: Insufficient documentation

## 2024-01-10 DIAGNOSIS — R058 Other specified cough: Secondary | ICD-10-CM | POA: Insufficient documentation

## 2024-01-10 DIAGNOSIS — R41840 Attention and concentration deficit: Secondary | ICD-10-CM | POA: Insufficient documentation

## 2024-01-10 DIAGNOSIS — Z609 Problem related to social environment, unspecified: Secondary | ICD-10-CM | POA: Insufficient documentation

## 2024-01-10 DIAGNOSIS — H833X3 Noise effects on inner ear, bilateral: Secondary | ICD-10-CM | POA: Insufficient documentation

## 2024-01-10 DIAGNOSIS — H53149 Visual discomfort, unspecified: Secondary | ICD-10-CM | POA: Insufficient documentation

## 2024-01-28 NOTE — Telephone Encounter (Signed)
 No further action needed at this time. Chart reviewed, results are in and HM modifier UTD

## 2024-01-29 DIAGNOSIS — F4323 Adjustment disorder with mixed anxiety and depressed mood: Secondary | ICD-10-CM | POA: Diagnosis not present

## 2024-02-08 ENCOUNTER — Ambulatory Visit (INDEPENDENT_AMBULATORY_CARE_PROVIDER_SITE_OTHER): Admitting: Audiology

## 2024-02-08 ENCOUNTER — Ambulatory Visit (INDEPENDENT_AMBULATORY_CARE_PROVIDER_SITE_OTHER): Admitting: Otolaryngology

## 2024-02-12 DIAGNOSIS — F4323 Adjustment disorder with mixed anxiety and depressed mood: Secondary | ICD-10-CM | POA: Diagnosis not present

## 2024-02-14 ENCOUNTER — Ambulatory Visit (INDEPENDENT_AMBULATORY_CARE_PROVIDER_SITE_OTHER): Admitting: Family Medicine

## 2024-02-14 ENCOUNTER — Encounter: Payer: Self-pay | Admitting: Family Medicine

## 2024-02-14 VITALS — BP 118/72 | HR 83 | Temp 98.1°F | Ht 65.0 in | Wt 120.0 lb

## 2024-02-14 DIAGNOSIS — Z79890 Hormone replacement therapy (postmenopausal): Secondary | ICD-10-CM | POA: Diagnosis not present

## 2024-02-14 DIAGNOSIS — Z13220 Encounter for screening for lipoid disorders: Secondary | ICD-10-CM | POA: Diagnosis not present

## 2024-02-14 DIAGNOSIS — Z0000 Encounter for general adult medical examination without abnormal findings: Secondary | ICD-10-CM | POA: Diagnosis not present

## 2024-02-14 DIAGNOSIS — Z1231 Encounter for screening mammogram for malignant neoplasm of breast: Secondary | ICD-10-CM

## 2024-02-14 DIAGNOSIS — Z1211 Encounter for screening for malignant neoplasm of colon: Secondary | ICD-10-CM

## 2024-02-14 DIAGNOSIS — Z23 Encounter for immunization: Secondary | ICD-10-CM | POA: Diagnosis not present

## 2024-02-14 DIAGNOSIS — Z131 Encounter for screening for diabetes mellitus: Secondary | ICD-10-CM | POA: Diagnosis not present

## 2024-02-14 DIAGNOSIS — M8589 Other specified disorders of bone density and structure, multiple sites: Secondary | ICD-10-CM

## 2024-02-14 LAB — CBC
HCT: 40.5 % (ref 36.0–46.0)
Hemoglobin: 13.5 g/dL (ref 12.0–15.0)
MCHC: 33.4 g/dL (ref 30.0–36.0)
MCV: 92.4 fl (ref 78.0–100.0)
Platelets: 253 K/uL (ref 150.0–400.0)
RBC: 4.38 Mil/uL (ref 3.87–5.11)
RDW: 12.7 % (ref 11.5–15.5)
WBC: 3.2 K/uL — ABNORMAL LOW (ref 4.0–10.5)

## 2024-02-14 LAB — LIPID PANEL
Cholesterol: 212 mg/dL — ABNORMAL HIGH (ref 0–200)
HDL: 70.8 mg/dL (ref >39.00)
LDL Cholesterol: 119 mg/dL — ABNORMAL HIGH (ref 0–99)
NonHDL: 141.16
Total CHOL/HDL Ratio: 3
Triglycerides: 113 mg/dL (ref 0.0–149.0)
VLDL: 22.6 mg/dL (ref 0.0–40.0)

## 2024-02-14 NOTE — Progress Notes (Signed)
 Patient ID: Sara Davidson, female  DOB: April 23, 1965, 58 y.o.   MRN: [id number] Patient Care Team    Relationship Specialty Notifications Start End  Catherine Charlies LABOR, DO PCP - General Family Medicine  03/04/21   Tasia Lung, MD Consulting Physician Psychiatry  03/04/21   Rubie Kemps, MD Consulting Physician Orthopedic Surgery  03/04/21   Veryl Handing, [facility]  Behavioral Health  03/04/21   Rox Charleston, MD Consulting Physician Obstetrics and Gynecology  03/04/21   Dianna Specking, MD Consulting Physician Gastroenterology  03/04/21   Patrcia Sharper, MD Consulting Physician Ophthalmology  03/04/21     Chief Complaint  Patient presents with   Annual Exam    Pt is fasting.     Subjective: Sara Davidson is a 58 y.o.  Female  present for CPE and Chronic Conditions/illness Management All past medical history, surgical history, allergies, family history, immunizations, medications and social history were updated in the electronic medical record today. All recent labs, ED visits and hospitalizations within the last year were reviewed.  Health maintenance:  Colonoscopy: completed 05/25/2016, by Dr. Dianna squires up 10 yrs.  Mammogram: completed:07/19/2023,  at [facility] Cervical cancer screening: Hysterectomy Immunizations: tdap UTD 2022, Influenza UTD 2025 (encouraged yearly), shingrix completed, Prevnar given today Infectious disease screening: HIV completed, Hep C completed  DEXA:completed 05/2023, result osteopenia, -1.3. follow needed 2 yrs ([place] image) Patient has a Dental home. Hospitalizations/ED visits: reviewed     02/14/2024    9:05 AM 02/26/2023   10:11 AM 01/23/2023   11:18 AM 03/04/2021    2:33 PM 12/21/2017   11:09 AM  Depression screen PHQ 2/9  Decreased Interest 0 0 0 0 0  Down, Depressed, Hopeless 0 0 0 0 0  PHQ - 2 Score 0 0 0 0 0  Altered sleeping 0 0 0    Tired, decreased energy 0 3 0    Change in appetite 0 0 0    Feeling bad or  failure about yourself  0 0 0    Trouble concentrating 1 0 0    Moving slowly or fidgety/restless 0 0 0    Suicidal thoughts 0 0 0    PHQ-9 Score 1 3 0    Difficult doing work/chores Not difficult at all Not difficult at all Not difficult at all        02/14/2024    9:05 AM  GAD 7 : Generalized Anxiety Score  Nervous, Anxious, on Edge 0  Control/stop worrying 0  Worry too much - different things 0  Trouble relaxing 0  Restless 0  Easily annoyed or irritable 0  Afraid - awful might happen 0  Total GAD 7 Score 0  Anxiety Difficulty Not difficult at all    Immunization History  Administered Date(s) Administered   INFLUENZA, HIGH DOSE SEASONAL PF 12/19/2023   Influenza, Seasonal, Injecte, Preservative Fre 02/26/2023   Influenza,inj,Quad PF,6+ Mos 01/25/2016   Influenza-Unspecified 02/10/2014, 01/15/2021   PFIZER(Purple Top)SARS-COV-2 Vaccination 05/31/2019, 06/24/2019, 08/03/2020   PNEUMOCOCCAL CONJUGATE-20 02/14/2024   Pfizer Covid-19 Vaccine Bivalent Booster 21yrs & up 01/20/2021   Pfizer(Comirnaty)Fall Seasonal Vaccine 12 years and older 01/06/2024   Td 08/26/2020   Zoster Recombinant(Shingrix) 08/26/2020, 03/04/2021    Past Medical History:  Diagnosis Date   Anemia    Anxiety    Chicken pox    CIN I (cervical intraepithelial neoplasia I)    Contact lens/glasses fitting    wears contacts or glasses   COVID-19 01/23/2023   Cyclical mastalgia  Depression    Headache    migraines   Papanicolaou smear of vagina with high grade squamous intraepithelial lesion (HGSIL)    Rib pain 03/04/2021   Allergies  Allergen Reactions   Sulfa Antibiotics Hives   Latex Other (See Comments)   Past Surgical History:  Procedure Laterality Date   BREAST BIOPSY Right 07/04/2012   Procedure: Right Breast Wire Localized Excision;  Surgeon: Donnice Bury, MD;  Location: [facility];  Service: General;  Laterality: Right;   CERVICAL CONIZATION W/BX  2004   FOOT  BONE EXCISION Right 03/2013   RIGHT FOOT 5TH METATARSAL SURGERY WITH PIN   INGUINAL HERNIA REPAIR Right 2015   WITH MESH   KNEE ARTHROSCOPY W/ ACL RECONSTRUCTION  [id number]   Left   LAPAROSCOPIC VAGINAL HYSTERECTOMY WITH SALPINGECTOMY Bilateral 09/26/2017   Procedure: LAPAROSCOPIC ASSISTED VAGINAL HYSTERECTOMY WITH SALPINGECTOMY;  Surgeon: Gretta Gums, MD;  Location: [facility];  Service: Gynecology;  Laterality: Bilateral;  DR GRETTA REQUESTING 3 HOURS OF OR TIME   LIPOMA EXCISION  2008   lt forearm   METATARSAL OSTEOTOMY     SHOULDER ARTHROSCOPY Left 01/2013   SHOULDER ARTHROSCOPY     TOTAL LAPAROSCOPIC HYSTERECTOMY WITH SALPINGECTOMY Bilateral 09/26/2017   Procedure: LAPAROSCOPIC ASSISTED VAGINAL HYSTERECTOMY WITH SALPINGECTOMY; Surgeon: Gretta Gums, MD; Location: [facility]; Service: Gynecology; Laterality: Bilateral; DR GRETTA REQUESTING 3 HOURS OF OR TIME   UMBILICAL HERNIA REPAIR  2011   UMB   Family History  Problem Relation Age of Onset   Stroke Mother    Hyperlipidemia Mother    Arthritis Mother    Breast cancer Mother    Depression Father    Learning disabilities Father    Hearing loss Father    Early death Father    Bipolar disorder Father    Suicidality Father    Learning disabilities Daughter    Depression Daughter    Asthma Daughter    Depression Daughter    Arthritis Maternal Grandmother    Heart attack Maternal Grandfather    Arthritis Paternal Grandmother    Heart attack Paternal Grandfather    Social History   Social History Narrative   Marital status/children/pets: Married.  2 grown children.   Education/employment: Bachelor's of arts-anthropology.  She is an tree surgeon.   Field Seismologist:      -Wears a bicycle helmet riding a bike: Yes     -smoke alarm in the home:Yes     - wears seatbelt: Yes     - Feels safe in their relationships: Yes       Allergies as of 02/14/2024       Reactions   Sulfa Antibiotics Hives   Latex Other (See Comments)         Medication List        Accurate as of February 14, 2024  9:28 AM. If you have any questions, ask your nurse or doctor.          B2 100 MG Tabs Take 200 mg by mouth every evening.   buPROPion  150 MG 24 hr tablet Commonly known as: WELLBUTRIN  XL Take 1 tablet (150 mg total) by mouth daily.   CALCIUM 1200 PO Take by mouth.   cetirizine 10 MG tablet Commonly known as: ZYRTEC Take 10 mg by mouth daily.   cholecalciferol 1000 units tablet Commonly known as: VITAMIN D  Take 1,000 Units by mouth daily. Sublingual drops   COLLAGEN PO Take by mouth.   estradiol  1 MG tablet Commonly known as: ESTRACE   Take 1 mg by mouth daily.   K2 PLUS D3 PO Take 1 Dose by mouth every Monday, Wednesday, and Friday. Vitamin d3 (1000 units)-Vitamin K2 (25 mcg) per dropperful Take 1 dropperful on Mondays, Wednesdays, & Fridays.   MAG-200 PO Take 200 mg by mouth every evening.   OVER THE COUNTER MEDICATION VisionMD softgels   PROBIOTIC-PREBIOTIC PO Take by mouth.   rizatriptan  10 MG tablet Commonly known as: MAXALT  Take 1 tablet (10 mg total) by mouth as needed for migraine. May repeat in 2 hours if needed   TURMERIC-GINGER PO Take by mouth.        All past medical history, surgical history, allergies, family history, immunizations andmedications were updated in the EMR today and reviewed under the history and medication portions of their EMR.      Review of Systems  Constitutional: Negative.   HENT: Negative.    Eyes: Negative.   Respiratory: Negative.    Cardiovascular: Negative.   Gastrointestinal: Negative.   Genitourinary: Negative.   Musculoskeletal: Negative.   Skin: Negative.   Neurological: Negative.   Endo/Heme/Allergies: Negative.   Psychiatric/Behavioral: Negative.    All other systems reviewed and are negative.  14 pt review of systems performed and negative (unless mentioned in an HPI)  Objective: BP 118/72   Pulse 83   Temp 98.1 F (36.7 C)   Ht 5'  5 (1.651 m)   Wt 120 lb (54.4 kg)   LMP 09/24/2017 (Exact Date)   SpO2 98%   BMI 19.97 kg/m  Physical Exam Vitals and nursing note reviewed.  Constitutional:      General: She is not in acute distress.    Appearance: Normal appearance. She is not ill-appearing or toxic-appearing.  HENT:     Head: Normocephalic and atraumatic.     Right Ear: Tympanic membrane, ear canal and external ear normal. There is no impacted cerumen.     Left Ear: Tympanic membrane, ear canal and external ear normal. There is no impacted cerumen.     Nose: No congestion or rhinorrhea.     Mouth/Throat:     Mouth: Mucous membranes are moist.     Pharynx: Oropharynx is clear. No oropharyngeal exudate or posterior oropharyngeal erythema.  Eyes:     General: No scleral icterus.       Right eye: No discharge.        Left eye: No discharge.     Extraocular Movements: Extraocular movements intact.     Conjunctiva/sclera: Conjunctivae normal.     Pupils: Pupils are equal, round, and reactive to light.  Cardiovascular:     Rate and Rhythm: Normal rate and regular rhythm.     Pulses: Normal pulses.     Heart sounds: Normal heart sounds. No murmur heard.    No friction rub. No gallop.  Pulmonary:     Effort: Pulmonary effort is normal. No respiratory distress.     Breath sounds: Normal breath sounds. No stridor. No wheezing, rhonchi or rales.  Chest:     Chest wall: No tenderness.  Abdominal:     General: Abdomen is flat. Bowel sounds are normal. There is no distension.     Palpations: Abdomen is soft. There is no mass.     Tenderness: There is no abdominal tenderness. There is no right CVA tenderness, left CVA tenderness, guarding or rebound.     Hernia: No hernia is present.  Musculoskeletal:        General: No swelling, tenderness or deformity. Normal  range of motion.     Cervical back: Normal range of motion and neck supple. No rigidity or tenderness.     Right lower leg: No edema.     Left lower leg: No  edema.  Lymphadenopathy:     Cervical: No cervical adenopathy.  Skin:    General: Skin is warm and dry.     Coloration: Skin is not jaundiced or pale.     Findings: No bruising, erythema, lesion or rash.  Neurological:     General: No focal deficit present.     Mental Status: She is alert and oriented to person, place, and time. Mental status is at baseline.     Cranial Nerves: No cranial nerve deficit.     Sensory: No sensory deficit.     Motor: No weakness.     Coordination: Coordination normal.     Gait: Gait normal.     Deep Tendon Reflexes: Reflexes normal.  Psychiatric:        Mood and Affect: Mood normal.        Behavior: Behavior normal.        Thought Content: Thought content normal.        Judgment: Judgment normal.      No results found.  Assessment/plan: Yesly Gerety is a 58 y.o. female present for CPE  Routine general medical examination at a health care facility Patient was encouraged to exercise greater than 150 minutes a week. Patient was encouraged to choose a diet filled with fresh fruits and vegetables, and lean meats. AVS provided to patient today for education/recommendation on gender specific health and safety maintenance. Colonoscopy: completed 05/25/2016, by Dr. Dianna squires up 10 yrs.  Mammogram: completed:07/19/2023,  at [facility] Cervical cancer screening: Hysterectomy Immunizations: tdap UTD 2022, Influenza UTD 2025 (encouraged yearly), shingrix completed, Prevnar given today Infectious disease screening: HIV completed, Hep C completed  DEXA:completed 05/2023, result osteopenia, -1.3. follow needed 2-3 yrs ([place] image) Diabetes mellitus screening/Hormone replacement therapy - Hemoglobin A1c Lipid screening/Hormone replacement therapy - Lipid panel Colon cancer screening UTD Need for vaccination for pneumococcus Given today Osteopenia of multiple sites UTD 06/07/2023- rpt 2 yrs.  T score: -1.3 Exercising routinely   Return in about  1 year (around 02/14/2025) for cpe (20 min).  Orders Placed This Encounter  Procedures   Pneumococcal conjugate vaccine 20-valent   CBC   Hemoglobin A1c   Lipid panel    No orders of the defined types were placed in this encounter.  Referral Orders  No referral(s) requested today     Electronically signed by: Charlies Bellini, DO [facility]

## 2024-02-14 NOTE — Patient Instructions (Addendum)

## 2024-02-15 ENCOUNTER — Encounter: Payer: Self-pay | Admitting: Family Medicine

## 2024-02-15 ENCOUNTER — Ambulatory Visit: Payer: Self-pay | Admitting: Family Medicine

## 2024-02-15 LAB — HEMOGLOBIN A1C: Hgb A1c MFr Bld: 5.2 % (ref 4.6–6.5)

## 2024-02-28 DIAGNOSIS — F4323 Adjustment disorder with mixed anxiety and depressed mood: Secondary | ICD-10-CM | POA: Diagnosis not present

## 2024-03-14 ENCOUNTER — Ambulatory Visit (INDEPENDENT_AMBULATORY_CARE_PROVIDER_SITE_OTHER): Admitting: Otolaryngology

## 2024-03-14 ENCOUNTER — Ambulatory Visit (INDEPENDENT_AMBULATORY_CARE_PROVIDER_SITE_OTHER): Admitting: Audiology

## 2024-03-17 DIAGNOSIS — F4323 Adjustment disorder with mixed anxiety and depressed mood: Secondary | ICD-10-CM | POA: Diagnosis not present

## 2024-03-20 DIAGNOSIS — H43813 Vitreous degeneration, bilateral: Secondary | ICD-10-CM | POA: Diagnosis not present

## 2024-03-20 DIAGNOSIS — H2513 Age-related nuclear cataract, bilateral: Secondary | ICD-10-CM | POA: Diagnosis not present

## 2024-03-20 DIAGNOSIS — H524 Presbyopia: Secondary | ICD-10-CM | POA: Diagnosis not present

## 2024-03-20 DIAGNOSIS — H5213 Myopia, bilateral: Secondary | ICD-10-CM | POA: Diagnosis not present

## 2024-03-20 DIAGNOSIS — H04123 Dry eye syndrome of bilateral lacrimal glands: Secondary | ICD-10-CM | POA: Diagnosis not present

## 2024-04-23 ENCOUNTER — Ambulatory Visit: Admitting: Sports Medicine

## 2024-04-23 VITALS — HR 78 | Ht 65.0 in | Wt 121.0 lb

## 2024-04-23 DIAGNOSIS — S0083XA Contusion of other part of head, initial encounter: Secondary | ICD-10-CM | POA: Diagnosis not present

## 2024-04-23 NOTE — Progress Notes (Unsigned)
"             ° °   Ben Jackson D.[facility] [address] [place] [address] Phone: [phone]   Assessment and Plan:     ***    Pertinent previous records reviewed include ***   Follow Up: ***     Subjective:   I, Sara Davidson, am serving as a neurosurgeon for Doctor Morene Mace  Chief Complaint: cheek bone pain   HPI:   04/24/2024 Patient has cheek bone pain. Patient states   Relevant Historical Information: ***  Additional pertinent review of systems negative.  Current Medications[1]   Objective:     There were no vitals filed for this visit.    There is no height or weight on file to calculate BMI.    Physical Exam:    ***   Electronically signed by:  Odis Mace D.[facility] 7:45 AM 04/23/2024    [1]  Current Outpatient Medications:    Bacillus Coagulans-Inulin (PROBIOTIC-PREBIOTIC PO), Take by mouth., Disp: , Rfl:    buPROPion  (WELLBUTRIN  XL) 150 MG 24 hr tablet, Take 1 tablet (150 mg total) by mouth daily., Disp: 90 tablet, Rfl: 1   Calcium Carbonate-Vit D-Min (CALCIUM 1200 PO), Take by mouth., Disp: , Rfl:    cetirizine (ZYRTEC) 10 MG tablet, Take 10 mg by mouth daily., Disp: , Rfl:    cholecalciferol (VITAMIN D ) 1000 units tablet, Take 1,000 Units by mouth daily. Sublingual drops, Disp: , Rfl:    COLLAGEN PO, Take by mouth., Disp: , Rfl:    estradiol  (ESTRACE ) 1 MG tablet, Take 1 mg by mouth daily., Disp: , Rfl:    Magnesium Oxide (MAG-200 PO), Take 200 mg by mouth every evening., Disp: , Rfl:    OVER THE COUNTER MEDICATION, VisionMD softgels, Disp: , Rfl:    Riboflavin (B2) 100 MG TABS, Take 200 mg by mouth every evening., Disp: , Rfl:    rizatriptan  (MAXALT ) 10 MG tablet, Take 1 tablet (10 mg total) by mouth as needed for migraine. May repeat in 2 hours if needed, Disp: 10 tablet, Rfl: 11   TURMERIC-GINGER PO, Take by mouth., Disp: , Rfl:    Vitamin D -Vitamin K (K2 PLUS D3 PO), Take 1 Dose by  mouth every Monday, Wednesday, and Friday. Vitamin d3 (1000 units)-Vitamin K2 (25 mcg) per dropperful Take 1 dropperful on Mondays, Wednesdays, & Fridays., Disp: , Rfl:   "

## 2024-04-23 NOTE — Progress Notes (Signed)
 "               [facility] [address] [place] [address] Phone: [phone]   Assessment and Plan:     1. Contusion of face, initial encounter (Primary) -Acute, initial visit - Consistent with contusion of left inferior orbit with localized pain, tenderness.  No step-off, no signs of fracture.  No change in vision. - With no red flag symptoms, no imaging obtained at today's visit - Recommend cold compresses over areas of pain for 1 week - Start meloxicam  15 mg daily x2 weeks.  If still having pain after 2 weeks, complete 3rd-week of NSAID. May use remaining NSAID as needed once daily for pain control.  Do not to use additional over-the-counter NSAIDs (ibuprofen , naproxen , Advil , Aleve , etc.) while taking prescription NSAIDs.  May use Tylenol  [phone] mg 2 to 3 times a day for breakthrough pain.  Patient can call for refill if needed    Pertinent previous records reviewed include none   Follow Up: As needed   Subjective:   I, Sara Davidson, am serving as a neurosurgeon for Doctor Sara Davidson  Chief Complaint: cheek bone pain   HPI:   04/23/2024 Patient has cheek bone pain. Patient states Sunday she tripped and hit her face on a dresser. Has pressure in her face when she lays down   Relevant Historical Information: None pertinent  Additional pertinent review of systems negative.  Current Medications[1]   Objective:     Vitals:   04/23/24 1329  Pulse: 78  SpO2: 98%  Weight: 121 lb (54.9 kg)  Height: 5' 5 (1.651 m)      Body mass index is 20.14 kg/m.    Physical Exam:    General: Well-appearing, cooperative, sitting comfortably in no acute distress.  HEENT: Normocephalic.  Minimal ecchymosis along the inferior left orbit, left nasal bridge.  TTP left nasal bridge, left inferior orbit, left lateral orbit, left maxilla.  No step-off.  No floating with palpation Neck: No gross abnormality.  Cardiovascular: No pallor or  cyanosis. Resp: Comfortable WOB.   Abdomen: Non distended.   Skin: Warm and dry; no focal rashes identified on limited exam. Extremities: No cyanosis or edema.  Neuro: Gross motor and sensory intact. Gait normal. Psychiatric: Mood and affect are appropriate.    Electronically signed by:  [facility] 1:47 PM 04/23/2024     [1]  Current Outpatient Medications:    Bacillus Coagulans-Inulin (PROBIOTIC-PREBIOTIC PO), Take by mouth., Disp: , Rfl:    buPROPion  (WELLBUTRIN  XL) 150 MG 24 hr tablet, Take 1 tablet (150 mg total) by mouth daily., Disp: 90 tablet, Rfl: 1   Calcium Carbonate-Vit D-Min (CALCIUM 1200 PO), Take by mouth., Disp: , Rfl:    cetirizine (ZYRTEC) 10 MG tablet, Take 10 mg by mouth daily., Disp: , Rfl:    cholecalciferol (VITAMIN D ) 1000 units tablet, Take 1,000 Units by mouth daily. Sublingual drops, Disp: , Rfl:    COLLAGEN PO, Take by mouth., Disp: , Rfl:    estradiol  (ESTRACE ) 1 MG tablet, Take 1 mg by mouth daily., Disp: , Rfl:    Magnesium Oxide (MAG-200 PO), Take 200 mg by mouth every evening., Disp: , Rfl:    OVER THE COUNTER MEDICATION, VisionMD softgels, Disp: , Rfl:    Riboflavin (B2) 100 MG TABS, Take 200 mg by mouth every evening., Disp: , Rfl:    rizatriptan  (MAXALT ) 10 MG tablet, Take 1  tablet (10 mg total) by mouth as needed for migraine. May repeat in 2 hours if needed, Disp: 10 tablet, Rfl: 11   TURMERIC-GINGER PO, Take by mouth., Disp: , Rfl:    Vitamin D -Vitamin K (K2 PLUS D3 PO), Take 1 Dose by mouth every Monday, Wednesday, and Friday. Vitamin d3 (1000 units)-Vitamin K2 (25 mcg) per dropperful Take 1 dropperful on Mondays, Wednesdays, & Fridays., Disp: , Rfl:   "

## 2024-04-23 NOTE — Patient Instructions (Signed)
 Cold compresses for 1 week   - Start meloxicam  15 mg daily x2 weeks.  If still having pain after 2 weeks, complete 3rd-week of NSAID. May use remaining NSAID as needed once daily for pain control.  Do not to use additional over-the-counter NSAIDs (ibuprofen , naproxen , Advil , Aleve , etc.) while taking prescription NSAIDs.  May use Tylenol  [phone] mg 2 to 3 times a day for breakthrough pain.  As needed follow up

## 2024-04-24 ENCOUNTER — Ambulatory Visit: Admitting: Sports Medicine

## 2024-05-07 ENCOUNTER — Other Ambulatory Visit (INDEPENDENT_AMBULATORY_CARE_PROVIDER_SITE_OTHER): Payer: Self-pay | Admitting: Otolaryngology

## 2024-05-07 DIAGNOSIS — H903 Sensorineural hearing loss, bilateral: Secondary | ICD-10-CM

## 2024-05-09 ENCOUNTER — Ambulatory Visit (INDEPENDENT_AMBULATORY_CARE_PROVIDER_SITE_OTHER): Admitting: Audiology

## 2024-05-09 ENCOUNTER — Ambulatory Visit (INDEPENDENT_AMBULATORY_CARE_PROVIDER_SITE_OTHER): Admitting: Otolaryngology

## 2024-05-09 ENCOUNTER — Encounter (INDEPENDENT_AMBULATORY_CARE_PROVIDER_SITE_OTHER): Payer: Self-pay | Admitting: Otolaryngology

## 2024-05-09 VITALS — BP 118/78 | HR 77

## 2024-05-09 DIAGNOSIS — H9042 Sensorineural hearing loss, unilateral, left ear, with unrestricted hearing on the contralateral side: Secondary | ICD-10-CM

## 2024-05-09 DIAGNOSIS — H90A32 Mixed conductive and sensorineural hearing loss, unilateral, left ear with restricted hearing on the contralateral side: Secondary | ICD-10-CM

## 2024-05-09 DIAGNOSIS — H6123 Impacted cerumen, bilateral: Secondary | ICD-10-CM

## 2024-05-09 DIAGNOSIS — H906 Mixed conductive and sensorineural hearing loss, bilateral: Secondary | ICD-10-CM

## 2024-05-09 NOTE — Progress Notes (Signed)
" °  [address], [address] [place], [place] [address] [phone]  Audiological Evaluation    Name: Sara Davidson     DOB:   02/13/1966      MRN:   [id number]                                                                                     Service Date: 05/09/2024     Accompanied by: self     Patient comes today after Dr. Karis, ENT sent a referral for a hearing evaluation due to concerns with hearing loss asymmetry.   Symptoms Yes Details  Hearing loss  [x]  Previously tested at [facility]. [facility]. Longstanding left hearing mixed hearing asymmetry. Patient does not perceived changes in her hearing within the last year, although sensititvuty to sound.  Tinnitus  []    Ear pain/ infections/pressure  []    Balance problems  []    Noise exposure history  []    Previous ear surgeries  []    Family history of hearing loss  [x]  Sister also has hearing loss, reports she has had hearing loss since she was a child.  Amplification  [x]  Reports does not wear her CROS hearing aids that were fit at [facility]  Other  []      Otoscopy: Right ear: Clear external ear canal and notable landmarks visualized on the tympanic membrane.  Left ear:  Unable to visualize tympanic membrane due to narrow external ear canal and presence of cerumen.  Tympanometry: Right ear: Type A - Normal external ear canal volume with normal middle ear pressure and normal tympanic membrane compliance. Findings are consistent with normal middle ear function. Left ear: Type A - Normal external ear canal volume with normal middle ear pressure and normal tympanic membrane compliance. Findings are consistent with normal middle ear function.   Hearing Evaluation The hearing test results were completed under headphones and results are deemed to be of good to fair reliability. Test technique:  conventional    Pure tone Audiometry: Right ear- Normal to mild sensorineural hearing loss from 250 Hz - 8000 Hz. Left ear-  Severe to  profound mixed hearing loss from 250 Hz - 8000 Hz.  Speech Audiometry: Right ear- Speech Reception Threshold (SRT) was obtained at 25 dBHL. Left ear-Speech Reception Threshold (SRT) was obtained at 80 dBHL, with contralateral masking.   Word Recognition Score Tested using NU-6 (recorded) Right ear: 100% was obtained at a presentation level of 65 dBHL with contralateral masking which is deemed as  excellent. Left ear: 32% was obtained at a presentation level of 90 dBHL(reported MCL) with contralateral masking which is deemed as  poor.   Impression: There is a significant difference in pure-tone thresholds between ears. There is a significant difference in word recognition scores , worse of the left ear.     Recommendations: Follow up with ENT as scheduled. Return for a hearing evaluation if concerns with hearing changes arise or per MD recommendation. Hearing aid follow up with her audiologist.   ROSALINE EARNIE SARNA, AUD  "

## 2024-05-09 NOTE — Progress Notes (Signed)
 Patient ID: Ryver Zadrozny, female   DOB: 1966-02-14, 59 y.o.   MRN: [id number]  CC: Asymmetric left ear hearing loss  HPI: The patient is a 59 year old female who returns today for follow-up evaluation.  The patient was last seen in May 2024. The patient has a history of severe asymmetric left ear hearing loss. Her hearing loss was gradual in onset over a 3-year period. Her brain MRI showed no evidence of intracranial lesion. The patient returns today reporting no significant change in her hearing.  She was fitted with a CROS hearing aid.  She uses the hearing aid intermittently.  She denies any otalgia, otorrhea, or vertigo.  Exam: General: Communicates without difficulty, well nourished, no acute distress. Head: Normocephalic, no evidence injury, no tenderness, facial buttresses intact without stepoff. Face/sinus: No tenderness to palpation and percussion. Facial movement is normal and symmetric. Eyes: PERRL, EOMI. No scleral icterus, conjunctivae clear. Neuro: CN II exam reveals vision grossly intact. Ears: Auricles well formed without lesions.  EAC: Bilateral cerumen impaction.  Under the operating microscope, the cerumen is carefully removed with a combination of cerumen currette, alligator forceps, and suction catheters.  After the cerumen is removed, the TMs are noted to be normal.  Nose: External evaluation reveals normal support and skin without lesions. Dorsum is intact. Anterior rhinoscopy reveals healthy pink mucosa over anterior aspect of inferior turbinates and intact septum. No purulence noted. Oral:  Oral cavity and oropharynx are intact, symmetric, without erythema or edema. Mucosa is moist without lesions. Neck: Full range of motion without pain. There is no significant lymphadenopathy. No masses palpable. Thyroid  bed within normal limits to palpation. Parotid glands and submandibular glands equal bilaterally without mass. Trachea is midline. Neuro:  CN 2-12 grossly intact. Gait  normal. Vestibular: No nystagmus at any point of gaze. The cerebellar examination is unremarkable.   Procedure: Bilateral cerumen disimpaction Anesthesia: None Description: Under the operating microscope, the cerumen is carefully removed with a combination of cerumen currette, alligator forceps, and suction catheters.  After the cerumen is removed, the TMs are noted to be normal.  No mass, erythema, or lesions. The patient tolerated the procedure well.   Assessment: 1.  Bilateral cerumen impaction.  After the disimpaction procedure, the patient's ear canals, tympanic membranes, and middle ear spaces are noted to be normal. 2.  Stable asymmetric left severe sensorineural hearing loss. Her right ear hearing is borderline normal.   Plan: 1.  Otomicroscopy with bilateral cerumen disimpaction. 2.  The physical exam findings and the hearing test results are reviewed with the patient.  3.  Continue the use of her CROS hearing aid. 4.  The patient will return for reevaluation in 1 year, sooner if needed.

## 2024-05-13 ENCOUNTER — Ambulatory Visit: Admitting: Sports Medicine

## 2024-05-13 VITALS — BP 132/76 | HR 71 | Ht 65.0 in | Wt 121.0 lb

## 2024-05-13 DIAGNOSIS — S76111A Strain of right quadriceps muscle, fascia and tendon, initial encounter: Secondary | ICD-10-CM

## 2024-05-13 MED ORDER — CYCLOBENZAPRINE HCL 5 MG PO TABS: 5.0000 mg | ORAL_TABLET | Freq: Every day | ORAL | 0 refills | Status: AC

## 2024-05-13 MED ORDER — KETOROLAC TROMETHAMINE 60 MG/2ML IM SOLN
60.0000 mg | Freq: Once | INTRAMUSCULAR | Status: AC
  Administered 2024-05-13: 60 mg via INTRAMUSCULAR

## 2024-05-13 MED ORDER — MELOXICAM 15 MG PO TABS: ORAL_TABLET | ORAL | 0 refills | Status: AC

## 2024-05-13 MED ORDER — METHYLPREDNISOLONE ACETATE 80 MG/ML IJ SUSP
80.0000 mg | Freq: Once | INTRAMUSCULAR | Status: AC
  Administered 2024-05-13: 80 mg via INTRAMUSCULAR

## 2024-05-13 MED ORDER — METHOCARBAMOL 500 MG PO TABS: 500.0000 mg | ORAL_TABLET | Freq: Three times a day (TID) | ORAL | 1 refills | Status: AC | PRN

## 2024-05-13 NOTE — Progress Notes (Signed)
 "               Odis Mace D.[facility] [address] [place] [address] Phone: [phone]   Assessment and Plan:     1. Quadriceps strain, right, initial encounter (Primary) -Acute, initial visit - Consistent with strain of quadriceps and gluteal musculature from shoveling snow yesterday, 05/12/2024.  Pain started and progressed throughout the night, but no significant pain during activity.  Consistent with strain and not complete tear - Patient elected for IM injection of methylprednisone 80 mg/Toradol  60 mg.  Injection given in clinic today and tolerated well. - Start meloxicam  15 mg daily x2 weeks.  If still having pain after 2 weeks, complete 3rd-week of NSAID. May use remaining NSAID as needed once daily for pain control.  Do not to use additional over-the-counter NSAIDs (ibuprofen , naproxen , Advil , Aleve , etc.) while taking prescription NSAIDs.  May use Tylenol  [phone] mg 2 to 3 times a day for breakthrough pain.    For muscle relaxers use the following: - Robaxin  500 mg every 8 hours as needed for muscle spasms.  This is a minimally drowsy medication, so it can be used during the day - Flexeril  5 to 10 mg nightly as needed for muscle spasms.  This is a significantly drowsy medication, so it should be used at night prior to sleep -Do not use Robaxin  and Flexeril  within 8 hours of each other  -Recommend continuing icing the painful area for 48 hours and then transition to heat - When pain begins to decrease, start HEP for quadriceps musculature  15 additional minutes spent for educating Therapeutic Home Exercise Program.  This included exercises focusing on stretching, strengthening, with focus on eccentric aspects.   Long term goals include an improvement in range of motion, strength, endurance as well as avoiding reinjury. Patient's frequency would include in 1-2 times a day, 3-5 times a week for a duration of 6-12 weeks. Proper technique shown and  discussed handout in great detail with ATC.  All questions were discussed and answered.     Pertinent previous records reviewed include none   Follow Up: 2 to 3 weeks for reevaluation.  Could consider physical therapy versus imaging   Subjective:   I, Sara Davidson, am serving as a neurosurgeon for Doctor Morene Mace  Chief Complaint: multiple issues   HPI:   05/13/24 Patient is a 59 year old female with multiple complaints. Patient states she was shoveling yesterday. Pain in quad that that radiates up to her hip. She is TTP. She was wheeled in today. She was able to walk to table. Ibu hasn't helped only taken one dose. Has been using ice. No numbness or tingling. She is not able to get comfortable. She has pain when standing   Relevant Historical Information: None pertinent  Additional pertinent review of systems negative.  Current Medications[1]   Objective:     Vitals:   05/13/24 1108  BP: 132/76  Pulse: 71  SpO2: 100%  Weight: 121 lb (54.9 kg)  Height: 5' 5 (1.651 m)      Body mass index is 20.14 kg/m.    Physical Exam:    General: awake, alert, and oriented.  In acute distress due to leg pain.  Nontoxic Skin: no suspicious lesions or rashes Neuro:sensation intact distally with no deficits, normal muscle tone, no atrophy, strength 5/5 in all tested lower ext groups Psych: normal mood and affect, speech clear   Right leg/hip: No deformity, swelling  or wasting ROM Flexion 90, ext 30, IR 45, ER 45 TTP quadriceps, gluteal musculature, greater trochanter NTTP over the  si joint, lumbar spine Negative log roll with FROM For posterior lateral hip pain FABER Positive FADIR for posterior lateral hip pain Negative Piriformis test Gait antalgic.  Using wheelchair due to leg pain Pain with resisted knee extension, hip flexion  Electronically signed by:  Odis Mace D.[facility] 11:33 AM 05/13/24     [1]  Current Outpatient Medications:     cyclobenzaprine  (FLEXERIL ) 5 MG tablet, Take 1 tablet (5 mg total) by mouth at bedtime., Disp: 60 tablet, Rfl: 0   meloxicam  (MOBIC ) 15 MG tablet, Take 1 tablet daily for 2 weeks.  If still in pain after 2 weeks, take 1 tablet daily for an additional 1 week., Disp: 30 tablet, Rfl: 0   methocarbamol  (ROBAXIN ) 500 MG tablet, Take 1 tablet (500 mg total) by mouth every 8 (eight) hours as needed for muscle spasms., Disp: 30 tablet, Rfl: 1   Bacillus Coagulans-Inulin (PROBIOTIC-PREBIOTIC PO), Take by mouth., Disp: , Rfl:    buPROPion  (WELLBUTRIN  XL) 150 MG 24 hr tablet, Take 1 tablet (150 mg total) by mouth daily., Disp: 90 tablet, Rfl: 1   Calcium Carbonate-Vit D-Min (CALCIUM 1200 PO), Take by mouth., Disp: , Rfl:    cetirizine (ZYRTEC) 10 MG tablet, Take 10 mg by mouth daily., Disp: , Rfl:    cholecalciferol (VITAMIN D ) 1000 units tablet, Take 1,000 Units by mouth daily. Sublingual drops, Disp: , Rfl:    COLLAGEN PO, Take by mouth., Disp: , Rfl:    estradiol  (ESTRACE ) 1 MG tablet, Take 1 mg by mouth daily., Disp: , Rfl:    Magnesium Oxide (MAG-200 PO), Take 200 mg by mouth every evening., Disp: , Rfl:    OVER THE COUNTER MEDICATION, [employer] softgels, Disp: , Rfl:    Riboflavin (B2) 100 MG TABS, Take 200 mg by mouth every evening., Disp: , Rfl:    rizatriptan  (MAXALT ) 10 MG tablet, Take 1 tablet (10 mg total) by mouth as needed for migraine. May repeat in 2 hours if needed, Disp: 10 tablet, Rfl: 11   TURMERIC-GINGER PO, Take by mouth., Disp: , Rfl:    Vitamin D -Vitamin K (K2 PLUS D3 PO), Take 1 Dose by mouth every Monday, Wednesday, and Friday. Vitamin d3 (1000 units)-Vitamin K2 (25 mcg) per dropperful Take 1 dropperful on Mondays, Wednesdays, & Fridays., Disp: , Rfl:   "

## 2024-05-13 NOTE — Addendum Note (Signed)
 Addended by: TONNIE SHU D on: 05/13/2024 11:44 AM   Modules accepted: Orders

## 2024-05-13 NOTE — Patient Instructions (Addendum)
-   Start meloxicam  15 mg daily x2 weeks.  If still having pain after 2 weeks, complete 3rd-week of NSAID. May use remaining NSAID as needed once daily for pain control.  Do not to use additional over-the-counter NSAIDs (ibuprofen , naproxen , Advil , Aleve , etc.) while taking prescription NSAIDs.  May use Tylenol  [phone] mg 2 to 3 times a day for breakthrough pain.  Meloxicam  refill  Full Cocktail today. No NSAIDs for 24 hours.   For muscle relaxers use the following: - Robaxin  500 mg every 8 hours as needed for muscle spasms.  This is a minimally drowsy medication, so it can be used during the day - Flexeril  5 to 10 mg nightly as needed for muscle spasms.  This is a significantly drowsy medication, so it should be used at night prior to sleep -Do not use Robaxin  and Flexeril  within 8 hours of each other  Recommend ice for the next 48 hours and then transition to heat.  When pain begins to decrease, start gentle home exercise program for quadriceps muscles.  Recommend follow-up in 2 to 3 weeks for reevaluation

## 2024-05-14 ENCOUNTER — Telehealth: Payer: Self-pay | Admitting: Sports Medicine

## 2024-05-14 ENCOUNTER — Other Ambulatory Visit: Payer: Self-pay | Admitting: Sports Medicine

## 2024-05-14 MED ORDER — METHYLPREDNISOLONE 4 MG PO TBPK: ORAL_TABLET | ORAL | 0 refills | Status: AC

## 2024-05-14 NOTE — Telephone Encounter (Signed)
 Messaged via mychart and dosepak sent in

## 2024-05-14 NOTE — Progress Notes (Signed)
 Dosepak placed

## 2024-05-14 NOTE — Telephone Encounter (Signed)
 Patient messaged via my chart and called . She vocalized understanding

## 2024-05-14 NOTE — Telephone Encounter (Signed)
 Patient called, tearful on the phone, the muscle relaxer's day and night did not work and the pain medicine is not working. She did not get any sleep. She can barely move and she is in astronomical pain. Please advise.

## 2024-05-14 NOTE — Telephone Encounter (Signed)
 Pt called back, muscles/tendons in upper thigh/pelvic area causing great pain. Not sure if this was mentioned in visit yesterday.  Meds gave no relief from pain, unable to sleep. Appt today offered but pt wanted to get Dr. Keller thoughts on increasing meds etc

## 2024-05-14 NOTE — Telephone Encounter (Signed)
 I received a call from the patient's husband stating that Sara Davidson has been taking the medication prescribed but she is still in terrible pain and needs advice on what to do.  Please call patient.

## 2024-06-25 ENCOUNTER — Ambulatory Visit: Admitting: Family Medicine

## 2025-02-17 ENCOUNTER — Encounter: Admitting: Family Medicine
